# Patient Record
Sex: Female | Born: 1975
Health system: Southern US, Community
[De-identification: ages and names within clinical notes are randomized; demographics above are authoritative.]

## PROBLEM LIST (undated history)

## (undated) DIAGNOSIS — I10 Essential (primary) hypertension: Secondary | ICD-10-CM

## (undated) DIAGNOSIS — E119 Type 2 diabetes mellitus without complications: Secondary | ICD-10-CM

## (undated) HISTORY — DX: Type 2 diabetes mellitus without complications: E11.9

## (undated) HISTORY — DX: Essential (primary) hypertension: I10

---

## 1999-07-31 ENCOUNTER — Inpatient Hospital Stay (HOSPITAL_COMMUNITY): Admission: AD | Admit: 1999-07-31 | Discharge: 1999-07-31 | Payer: Self-pay | Admitting: Obstetrics

## 1999-08-01 ENCOUNTER — Ambulatory Visit (HOSPITAL_COMMUNITY): Admission: RE | Admit: 1999-08-01 | Discharge: 1999-08-01 | Payer: Self-pay | Admitting: Obstetrics

## 1999-09-07 ENCOUNTER — Inpatient Hospital Stay (HOSPITAL_COMMUNITY): Admission: AD | Admit: 1999-09-07 | Discharge: 1999-09-07 | Payer: Self-pay | Admitting: *Deleted

## 1999-09-09 ENCOUNTER — Encounter (INDEPENDENT_AMBULATORY_CARE_PROVIDER_SITE_OTHER): Payer: Self-pay

## 1999-09-09 ENCOUNTER — Observation Stay (HOSPITAL_COMMUNITY): Admission: AD | Admit: 1999-09-09 | Discharge: 1999-09-10 | Payer: Self-pay | Admitting: Obstetrics & Gynecology

## 2008-04-06 ENCOUNTER — Encounter: Payer: Self-pay | Admitting: Internal Medicine

## 2010-07-28 ENCOUNTER — Encounter: Payer: Self-pay | Admitting: Obstetrics & Gynecology

## 2010-07-29 ENCOUNTER — Ambulatory Visit (HOSPITAL_COMMUNITY)
Admission: RE | Admit: 2010-07-29 | Discharge: 2010-07-29 | Payer: Self-pay | Source: Home / Self Care | Attending: Obstetrics | Admitting: Obstetrics

## 2010-08-05 ENCOUNTER — Ambulatory Visit (HOSPITAL_COMMUNITY)
Admission: RE | Admit: 2010-08-05 | Discharge: 2010-08-05 | Payer: Self-pay | Source: Home / Self Care | Attending: Obstetrics | Admitting: Obstetrics

## 2010-08-05 LAB — GLUCOSE, CAPILLARY: Glucose-Capillary: 168 mg/dL — ABNORMAL HIGH (ref 70–99)

## 2010-08-05 LAB — CBC
HCT: 34.3 % — ABNORMAL LOW (ref 36.0–46.0)
Hemoglobin: 11.6 g/dL — ABNORMAL LOW (ref 12.0–15.0)
MCH: 27 pg (ref 26.0–34.0)
MCHC: 33.8 g/dL (ref 30.0–36.0)
MCV: 79.8 fL (ref 78.0–100.0)
Platelets: 339 10*3/uL (ref 150–400)
RBC: 4.3 MIL/uL (ref 3.87–5.11)
RDW: 15.6 % — ABNORMAL HIGH (ref 11.5–15.5)
WBC: 5.5 10*3/uL (ref 4.0–10.5)

## 2010-08-05 LAB — ABO/RH: ABO/RH(D): O POS

## 2010-08-07 LAB — GLUCOSE, CAPILLARY: Glucose-Capillary: 126 mg/dL — ABNORMAL HIGH (ref 70–99)

## 2010-08-08 ENCOUNTER — Other Ambulatory Visit: Payer: Self-pay | Admitting: Obstetrics

## 2010-08-08 ENCOUNTER — Inpatient Hospital Stay (HOSPITAL_COMMUNITY): Payer: BC Managed Care – PPO

## 2010-08-08 ENCOUNTER — Ambulatory Visit (HOSPITAL_COMMUNITY)
Admission: AD | Admit: 2010-08-08 | Discharge: 2010-08-08 | Disposition: A | Discharge status: Home / Self Care | Payer: BC Managed Care – PPO | Source: Ambulatory Visit | Attending: Obstetrics | Admitting: Obstetrics

## 2010-08-08 DIAGNOSIS — O034 Incomplete spontaneous abortion without complication: Secondary | ICD-10-CM | POA: Insufficient documentation

## 2010-08-08 LAB — GLUCOSE, CAPILLARY: Glucose-Capillary: 171 mg/dL — ABNORMAL HIGH (ref 70–99)

## 2010-08-08 LAB — CBC
MCHC: 33.3 g/dL (ref 30.0–36.0)
Platelets: 310 10*3/uL (ref 150–400)
RDW: 15.5 % (ref 11.5–15.5)

## 2010-08-26 NOTE — Op Note (Signed)
  Traci Warren, Traci Warren            ACCOUNT NO.:  [id number]  MEDICAL RECORD NO.:  [id number]           PATIENT TYPE:  I  LOCATION:  [facility]                          [facility]  PHYSICIAN:  Emilija Bohman A. Clearance Coots, M.D.DATE OF BIRTH:  1976/02/15  DATE OF PROCEDURE:  08/08/2010 DATE OF DISCHARGE:                              OPERATIVE REPORT   PREOPERATIVE DIAGNOSIS:  Retained products of conception after suction, dilation and evacuation for 8-week intrauterine fetal demise.  POSTOPERATIVE DIAGNOSIS:  Retained products of conception after suction, dilation and evacuation for 8-week intrauterine fetal demise, probable tightly adherent placenta.  PROCEDURE:  Suction, dilation and curettage.  SURGEON:  Vianna Venezia A. Clearance Coots, MD  ANESTHESIA:  General and local paracervical block with 20 mL of 1% Xylocaine.  FINDINGS:  Products of conception.  SPECIMEN:  Products of conception.  DISPOSITION OF SPECIMEN:  Pathology.  ESTIMATED BLOOD LOSS:  100 mL.  COMPLICATIONS:  None.  OPERATION:  The patient was brought to the operating room and after satisfactory general endotracheal anesthesia, the vagina was prepped and draped in the usual sterile fashion.  Urinary bladder was emptied of approximately 100 mL of clear urine.  Sterile speculum was inserted in the vaginal vault.  Products of conception could be seen within the cervical os.  The products of conception were grasped with a small ring forceps and submitted to Pathology for evaluation.  The endometrial surface was then thoroughly curetted with a medium sharp curette.  A #10 suction cath was then easily introduced into the uterine contents and the remainder of products of conception were evacuated.  The endometrial surface felt gritty all the way around after a conclusion of the procedure and the uterus contracted down quite well.  There was no active bleeding at the conclusion of the procedure.  All instruments were retired.  The  patient tolerated the procedure well, transported to the recovery room in satisfactory condition.     Jazlene Bares A. Clearance Coots, M.D.     CAH/MEDQ  D:  08/08/2010  T:  08/09/2010  Job:  [id number]  Electronically Signed by Coral Ceo M.D. on 08/26/2010 09:48:43 AM

## 2010-08-26 NOTE — Op Note (Signed)
  [name], [name]            ACCOUNT NO.:  [id number]  MEDICAL RECORD NO.:  [id number]          PATIENT TYPE:  AMB  LOCATION:  [facility]                           [facility]  PHYSICIAN:  Cambre Matson A. Clearance Coots, M.D.DATE OF BIRTH:  22-Sep-1975  DATE OF PROCEDURE:  08/05/2010 DATE OF DISCHARGE:  08/05/2010                              OPERATIVE REPORT   PREOPERATIVE DIAGNOSIS:  First trimester, missed abortion.  POSTOPERATIVE DIAGNOSIS:  First trimester, missed abortion.  PROCEDURE:  Suction, dilation, and evacuation.  SURGEON:  Jahmia Berrett A. Clearance Coots, MD  ANESTHESIA:  MAC with paracervical block, 1% Xylocaine plain.  ESTIMATED BLOOD LOSS:  100 mL.  COMPLICATIONS:  None.  FINDINGS:  Products of conception.  SPECIMENS:  Products of conception.  DISPOSITION:  Specimen to Pathology.  OPERATION:  The patient was brought to the operating room after satisfactory IV sedation.  The legs were brought up in stirrups and thevagina was prepped and draped in the usual sterile fashion.  The urinary bladder was emptied of approximately 100 mL of clear urine.  Sterile Graves speculum was inserted in the vaginal vault.  Cervix was isolated. The anterior lip of cervix was grasped with a single-tooth tenaculum. Paracervical block of 20 mL of 1% plain Xylocaine 10 mL injected in each lateral fornix.  The axis of the uterus was then sounded.  The cervix was then dilated to 25 mm with the [facility] dilators, #8 suction cath was easily introduced into the uterine cavity and all contents were evacuated.  The endometrial surface was then curetted with a small sharp curette and no further products were obtained.  There was no active bleeding at the conclusion of the procedure.  All instruments were retired.  The patient tolerated the procedure well and was transported to recovery room in satisfactory condition.    Traci Warren A. Clearance Coots, M.D.    CAH/MEDQ  D:  08/05/2010  T:  08/06/2010  Job:   [id number]  Electronically Signed by Coral Ceo M.D. on 08/26/2010 09:48:39 AM

## 2010-09-09 ENCOUNTER — Ambulatory Visit: Payer: Medicaid Other | Admitting: Internal Medicine

## 2010-09-13 ENCOUNTER — Other Ambulatory Visit: Payer: BC Managed Care – PPO

## 2010-09-13 ENCOUNTER — Other Ambulatory Visit: Payer: Self-pay | Admitting: Internal Medicine

## 2010-09-13 ENCOUNTER — Encounter: Payer: Self-pay | Admitting: Internal Medicine

## 2010-09-13 ENCOUNTER — Ambulatory Visit (INDEPENDENT_AMBULATORY_CARE_PROVIDER_SITE_OTHER): Payer: BC Managed Care – PPO | Admitting: Internal Medicine

## 2010-09-13 ENCOUNTER — Telehealth: Payer: Self-pay | Admitting: Internal Medicine

## 2010-09-13 DIAGNOSIS — E669 Obesity, unspecified: Secondary | ICD-10-CM

## 2010-09-13 DIAGNOSIS — I10 Essential (primary) hypertension: Secondary | ICD-10-CM

## 2010-09-13 DIAGNOSIS — E119 Type 2 diabetes mellitus without complications: Secondary | ICD-10-CM | POA: Insufficient documentation

## 2010-09-13 DIAGNOSIS — J45909 Unspecified asthma, uncomplicated: Secondary | ICD-10-CM | POA: Insufficient documentation

## 2010-09-13 LAB — HEPATIC FUNCTION PANEL
ALT: 24 U/L (ref 0–35)
Total Bilirubin: 0.5 mg/dL (ref 0.3–1.2)
Total Protein: 8 g/dL (ref 6.0–8.3)

## 2010-09-13 LAB — CBC WITH DIFFERENTIAL/PLATELET
Eosinophils Relative: 3.9 % (ref 0.0–5.0)
HCT: 34.4 % — ABNORMAL LOW (ref 36.0–46.0)
Lymphs Abs: 1.7 10*3/uL (ref 0.7–4.0)
MCV: 84.3 fl (ref 78.0–100.0)
Monocytes Absolute: 0.5 10*3/uL (ref 0.1–1.0)
Platelets: 345 10*3/uL (ref 150.0–400.0)
WBC: 6.4 10*3/uL (ref 4.5–10.5)

## 2010-09-13 LAB — URINALYSIS, ROUTINE W REFLEX MICROSCOPIC
Bilirubin Urine: NEGATIVE
Ketones, ur: NEGATIVE
Leukocytes, UA: NEGATIVE
Urobilinogen, UA: 0.2 (ref 0.0–1.0)

## 2010-09-13 LAB — BASIC METABOLIC PANEL
BUN: 26 mg/dL — ABNORMAL HIGH (ref 6–23)
CO2: 29 mEq/L (ref 19–32)
Chloride: 102 mEq/L (ref 96–112)
Glucose, Bld: 120 mg/dL — ABNORMAL HIGH (ref 70–99)
Potassium: 3.6 mEq/L (ref 3.5–5.1)

## 2010-09-13 LAB — LIPID PANEL
Cholesterol: 167 mg/dL (ref 0–200)
LDL Cholesterol: 98 mg/dL (ref 0–99)

## 2010-09-13 LAB — HEMOGLOBIN A1C: Hgb A1c MFr Bld: 7 % — ABNORMAL HIGH (ref 4.6–6.5)

## 2010-09-13 LAB — TSH: TSH: 1.52 u[IU]/mL (ref 0.35–5.50)

## 2010-09-16 ENCOUNTER — Encounter: Payer: Self-pay | Admitting: Internal Medicine

## 2010-09-16 ENCOUNTER — Telehealth: Payer: Self-pay | Admitting: Internal Medicine

## 2010-09-16 ENCOUNTER — Other Ambulatory Visit: Payer: BC Managed Care – PPO

## 2010-09-16 ENCOUNTER — Ambulatory Visit (INDEPENDENT_AMBULATORY_CARE_PROVIDER_SITE_OTHER): Payer: BC Managed Care – PPO | Admitting: Internal Medicine

## 2010-09-16 ENCOUNTER — Other Ambulatory Visit: Payer: Self-pay | Admitting: Internal Medicine

## 2010-09-16 DIAGNOSIS — E119 Type 2 diabetes mellitus without complications: Secondary | ICD-10-CM

## 2010-09-16 DIAGNOSIS — I10 Essential (primary) hypertension: Secondary | ICD-10-CM

## 2010-09-16 DIAGNOSIS — N179 Acute kidney failure, unspecified: Secondary | ICD-10-CM | POA: Insufficient documentation

## 2010-09-16 LAB — URINALYSIS, ROUTINE W REFLEX MICROSCOPIC
Bilirubin Urine: NEGATIVE
Ketones, ur: NEGATIVE
Nitrite: POSITIVE
Total Protein, Urine: NEGATIVE
pH: 5.5 (ref 5.0–8.0)

## 2010-09-16 LAB — BASIC METABOLIC PANEL
BUN: 21 mg/dL (ref 6–23)
Chloride: 99 mEq/L (ref 96–112)
Glucose, Bld: 101 mg/dL — ABNORMAL HIGH (ref 70–99)
Potassium: 3.8 mEq/L (ref 3.5–5.1)

## 2010-09-17 ENCOUNTER — Other Ambulatory Visit: Payer: Self-pay | Admitting: Internal Medicine

## 2010-09-17 DIAGNOSIS — N179 Acute kidney failure, unspecified: Secondary | ICD-10-CM

## 2010-09-17 NOTE — Letter (Signed)
Summary: Lipid Letter  [facility]  [address] [place], [place] [address]   Phone: [phone]  Fax: [phone]    09/13/2010  Traci Warren [address] [place], [place]  [address]  Dear Traci Warren:  We have carefully reviewed your last lipid profile from 09/13/2010 and the results are noted below with a summary of recommendations for lipid management.    Cholesterol:       167     Goal: <200   HDL "good" Cholesterol:   57.84     Goal: >50   LDL "bad" Cholesterol:   98     Goal: <100   Triglycerides:       169.0     Goal: <150    your labs show mild kidney dysfunction and anemia, please follow-up as soon as you can    TLC Diet (Therapeutic Lifestyle Change): Saturated Fats & Transfatty acids should be kept < 7% of total calories ***Reduce Saturated Fats Polyunstaurated Fat can be up to 10% of total calories Monounsaturated Fat Fat can be up to 20% of total calories Total Fat should be no greater than 25-35% of total calories Carbohydrates should be 50-60% of total calories Protein should be approximately 15% of total calories Fiber should be at least 20-30 grams a day ***Increased fiber may help lower LDL Total Cholesterol should be < 200mg /day Consider adding plant stanol/sterols to diet (example: Benacol spread) ***A higher intake of unsaturated fat may reduce Triglycerides and Increase HDL    Adjunctive Measures (may lower LIPIDS and reduce risk of Heart Attack) include: Aerobic Exercise (20-30 minutes 3-4 times a week) Limit Alcohol Consumption Weight Reduction Aspirin 75-81 mg a day by mouth (if not allergic or contraindicated) Dietary Fiber 20-30 grams a day by mouth     Current Medications: 1)    Metformin Hcl 500 Mg Tabs (Metformin hcl) .... 4 tabs daily 2)    Triamterene-hctz 37.5-25 Mg Tabs (Triamterene-hctz) .... Take 1 tablet by mouth every morning 3)    Carvedilol 6.25 Mg Tabs (Carvedilol) .... Take 1 tablet by mouth two times a  day 4)    Designer, multimedia W/device Kit (Blood glucose monitoring suppl) .... Use two times a day as ddirected 5)    Bayer Contour Test  Strp (Glucose blood) .... Use two times a day as directed  If you have any questions, please call. We appreciate being able to work with you.   Sincerely,    [facility] Traci Grandchild MD

## 2010-09-17 NOTE — Assessment & Plan Note (Signed)
Summary: New / Bcbs / #   Vital Signs:  Patient profile:   35 year old female Menstrual status:  regular LMP:     09/09/2010 Height:      59 inches Weight:      177 pounds BMI:     35.88 O2 Sat:      98 % on Room air Temp:     98.3 degrees F oral Pulse rate:   74 / minute Pulse rhythm:   regular Resp:     16 per minute BP sitting:   142 / 90  (left arm) Cuff size:   large  Vitals Entered By: Rock Nephew CMA (September 13, 2010 10:50 AM)  Nutrition Counseling: Patient's BMI is greater than 25 and therefore counseled on weight management options.  O2 Flow:  Room air  Primary Care Provider:  Etta Grandchild MD   History of Present Illness: New to me for evaluation and treatment of DM II - she tells me that she has been treated by a Gyn. named Dr. Clearance Coots but she does know what an A1C is and there are no prior records available to me today.     This is a 34 year old woman who presents for Follow-up visit.  The patient denies chest pain, palpitations, dizziness, syncope, low blood sugar symptoms, high blood sugar symptoms, edema, SOB, DOE, PND, and orthopnea.  Since the last visit the patient notes no new problems or concerns.  The patient reports taking meds as prescribed, not monitoring BP, not monitoring blood sugars, and dietary noncompliance.  When questioned about possible medication side effects, the patient notes none.    Hypertension History:      She denies headache, chest pain, palpitations, dyspnea with exertion, orthopnea, PND, peripheral edema, visual symptoms, neurologic problems, syncope, and side effects from treatment.  She notes no problems with any antihypertensive medication side effects.        Positive major cardiovascular risk factors include diabetes and hypertension.  Negative major cardiovascular risk factors include female age less than 35 years old, no history of hyperlipidemia, negative family history for ischemic heart disease, and non-tobacco-user status.        Further assessment for target organ damage reveals no history of ASHD, cardiac end-organ damage (CHF/LVH), stroke/TIA, peripheral vascular disease, renal insufficiency, or hypertensive retinopathy.     Preventive Screening-Counseling & Management  Alcohol-Tobacco     Alcohol drinks/day: 0     Alcohol Counseling: not indicated; patient does not drink     Smoking Status: never     Tobacco Counseling: not indicated; no tobacco use  Caffeine-Diet-Exercise     Does Patient Exercise: no  Hep-HIV-STD-Contraception     Hepatitis Risk: no risk noted     HIV Risk: no risk noted     STD Risk: no risk noted      Sexual History:  currently monogamous.        Drug Use:  no.        Blood Transfusions:  no.    Medications Prior to Update: 1)  None  Current Medications (verified): 1)  Metformin Hcl 500 Mg Tabs (Metformin Hcl) .... 4 Tabs Daily 2)  Triamterene-Hctz 37.5-25 Mg Tabs (Triamterene-Hctz) .... Take 1 Tablet By Mouth Every Morning 3)  Carvedilol 6.25 Mg Tabs (Carvedilol) .... Take 1 Tablet By Mouth Two Times A Day 4)  Bayer Contour Monitor W/device Kit (Blood Glucose Monitoring Suppl) .... Use Two Times A Day As Ddirected 5)  Bayer  Contour Test  Strp (Glucose Blood) .... 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 Never Smoked Alcohol use-no Drug use-no Regular exercise-no Smoking Status:  never Hepatitis Risk:  no risk noted HIV Risk:  no risk noted STD Risk:  no risk noted Sexual History:  currently monogamous Blood Transfusions:  no Drug Use:  no Does Patient Exercise:  no  Review of  Systems  The patient denies anorexia, fever, weight loss, weight gain, chest pain, syncope, dyspnea on exertion, peripheral edema, prolonged cough, headaches, hemoptysis, abdominal pain, hematuria, suspicious skin lesions, transient blindness, difficulty walking, depression, unusual weight change, abnormal bleeding, enlarged lymph nodes, and angioedema.   Endo:  Denies cold intolerance, excessive hunger, excessive thirst, excessive urination, heat intolerance, polyuria, and weight change.  Physical Exam  General:  alert, well-developed, well-nourished, well-hydrated, appropriate dress, normal appearance, healthy-appearing, cooperative to examination, good hygiene, and overweight-appearing.   Head:  normocephalic, atraumatic, no abnormalities observed, and no abnormalities palpated.   Eyes:  vision grossly intact and pupils equal.   Ears:  External ear exam shows no significant lesions or deformities.  Otoscopic examination reveals clear canals, tympanic membranes are intact bilaterally without bulging, retraction, inflammation or discharge. Hearing is grossly normal bilaterally. Nose:  External nasal examination shows no deformity or inflammation. Nasal mucosa are pink and moist without lesions or exudates. Mouth:  Oral mucosa and oropharynx without lesions or exudates.  Teeth in good repair. Neck:  No deformities, masses, or tenderness noted. Lungs:  Normal respiratory effort, chest expands symmetrically. Lungs are clear to auscultation, no crackles or wheezes. Heart:  Normal rate and regular rhythm. S1 and S2 normal without gallop, murmur, click, rub or other extra sounds. Abdomen:  Bowel sounds positive,abdomen soft and non-tender without masses, organomegaly or hernias noted. Msk:  No deformity or scoliosis noted of thoracic or lumbar spine.   Pulses:  R and L carotid,radial,femoral,dorsalis pedis and posterior tibial pulses are full and equal bilaterally Extremities:  No clubbing, cyanosis,  edema, or deformity noted with normal full range of motion of all joints.   Neurologic:  No cranial nerve deficits noted. Station and gait are normal. Plantar reflexes are down-going bilaterally. DTRs are symmetrical throughout. Sensory, motor and coordinative functions appear intact. Skin:  Intact without suspicious lesions or rashes Cervical Nodes:  No lymphadenopathy noted Axillary Nodes:  No palpable lymphadenopathy Psych:  Cognition and judgment appear intact. Alert and cooperative with normal attention span and concentration. No apparent delusions, illusions, hallucinations  Diabetes Management Exam:    Foot Exam (with socks and/or shoes not present):       Sensory-Pinprick/Light touch:          Left medial foot (L-4): normal          Left dorsal foot (L-5): normal          Left lateral foot (S-1): normal          Right medial foot (L-4): normal          Right dorsal foot (L-5): normal          Right lateral foot (S-1): normal       Sensory-Monofilament:  Left foot: normal          Right foot: normal       Inspection:          Left foot: normal          Right foot: normal       Nails:          Left foot: normal          Right foot: normal   Impression & Recommendations:  Problem # 1:  HYPERTENSION (ICD-401.9) Assessment New  she desires to become pregnant in the future so I did not prescribe an ACEI or an ARB, her current meds were given to her by her GYN doctor and she tells me that he told her they are ok for a female with a pregnancy risk  Her updated medication list for this problem includes:    Triamterene-hctz 37.5-25 Mg Tabs (Triamterene-hctz) .Marland Kitchen... Take 1 tablet by mouth every morning    Carvedilol 6.25 Mg Tabs (Carvedilol) .Marland Kitchen... Take 1 tablet by mouth two times a day  Orders: TLB-Lipid Panel (80061-LIPID) TLB-BMP (Basic Metabolic Panel-BMET) (80048-METABOL) TLB-CBC Platelet - w/Differential (85025-CBCD) TLB-Hepatic/Liver Function Pnl  (80076-HEPATIC) TLB-TSH (Thyroid Stimulating Hormone) (84443-TSH) TLB-A1C / Hgb A1C (Glycohemoglobin) (83036-A1C) TLB-Udip w/ Micro (81001-URINE) Diabetic Clinic Referral (Diabetic) Nutrition Referral (Nutrition)  BP today: 142/90  10 Yr Risk Heart Disease: Not enough information  Problem # 2:  DIABETES MELLITUS, TYPE II (ICD-250.00) Assessment: New no statin was prescribed as she is at risk of becoming pregnant  Her updated medication list for this problem includes:    Metformin Hcl 500 Mg Tabs (Metformin hcl) .Marland KitchenMarland KitchenMarland KitchenMarland Kitchen 4 tabs daily  Orders: TLB-Lipid Panel (80061-LIPID) TLB-BMP (Basic Metabolic Panel-BMET) (80048-METABOL) TLB-CBC Platelet - w/Differential (85025-CBCD) TLB-Hepatic/Liver Function Pnl (80076-HEPATIC) TLB-TSH (Thyroid Stimulating Hormone) (84443-TSH) TLB-A1C / Hgb A1C (Glycohemoglobin) (83036-A1C) TLB-Udip w/ Micro (81001-URINE) Ophthalmology Referral (Ophthalmology) Diabetic Clinic Referral (Diabetic) Nutrition Referral (Nutrition)  Problem # 3:  OBESITY (ICD-278.00) Assessment: New  Orders: Diabetic Clinic Referral (Diabetic) Nutrition Referral (Nutrition)  Ht: 59 (09/13/2010)   Wt: 177 (09/13/2010)   BMI: 35.88 (09/13/2010)  Complete Medication List: 1)  Metformin Hcl 500 Mg Tabs (Metformin hcl) .... 4 tabs daily 2)  Triamterene-hctz 37.5-25 Mg Tabs (Triamterene-hctz) .... Take 1 tablet by mouth every morning 3)  Carvedilol 6.25 Mg Tabs (Carvedilol) .... Take 1 tablet by mouth two times a day 4)  Designer, multimedia W/device Kit (Blood glucose monitoring suppl) .... Use two times a day as ddirected 5)  Bayer Contour Test Strp (Glucose blood) .... Use two times a day as directed  Hypertension Assessment/Plan:      The patient's hypertensive risk group is category C: Target organ damage and/or diabetes.  Today's blood pressure is 142/90.  Her blood pressure goal is < 130/80.  PAP Screening:    Last PAP smear:  04/06/2008    Reviewed PAP smear  recommendations:  patient defers to GYN provider  Osteoporosis Risk Assessment:  Risk Factors for Fracture or Low Bone Density:   Smoking status:       never  Immunization & Chemoprophylaxis:    Tetanus vaccine: Historical  (07/07/2009)  Patient Instructions: 1)  Please schedule a follow-up appointment in 1 month. 2)  It is important that you exercise regularly at least 20 minutes 5 times a week. If you develop chest pain, have severe difficulty breathing, or feel very tired , stop exercising immediately and seek medical attention. 3)  You need to lose weight. Consider a  lower calorie diet and regular exercise.  4)  You need to have a Pap Smear to prevent cervical cancer. 5)  If you could be exposed to sexually transmitted diseases, you should use a condom. 6)  If you are having sex and you or your partner don't want a child, use contraception. 7)  Check your blood sugars regularly. If your readings are usually above 200 or below 70 you should contact our office. 8)  It is important that your Diabetic A1c level is checked every 3 months. 9)  See your eye doctor yearly to check for diabetic eye damage. 10)  Check your feet each night for sore areas, calluses or signs of infection. 11)  Check your Blood Pressure regularly. If it is above 130/80: you should make an appointment. Prescriptions: BAYER CONTOUR TEST  STRP (GLUCOSE BLOOD) Use two times a day as directed  #100 x 0   Entered and Authorized by:   Etta Grandchild MD   Signed by:   Etta Grandchild MD on 09/13/2010   Method used:   Samples Given   RxID:   [id number] BAYER CONTOUR MONITOR W/DEVICE KIT (BLOOD GLUCOSE MONITORING SUPPL) Use two times a day as ddirected  #2 kits x 0   Entered and Authorized by:   Etta Grandchild MD   Signed by:   Etta Grandchild MD on 09/13/2010   Method used:   Samples Given   RxID:   [id number]    Orders Added: 1)  TLB-Lipid Panel [80061-LIPID] 2)  TLB-BMP (Basic Metabolic Panel-BMET)  [80048-METABOL] 3)  TLB-CBC Platelet - w/Differential [85025-CBCD] 4)  TLB-Hepatic/Liver Function Pnl [80076-HEPATIC] 5)  TLB-TSH (Thyroid Stimulating Hormone) [84443-TSH] 6)  TLB-A1C / Hgb A1C (Glycohemoglobin) [83036-A1C] 7)  TLB-Udip w/ Micro [81001-URINE] 8)  Ophthalmology Referral [Ophthalmology] 9)  Diabetic Clinic Referral [Diabetic] 10)  Nutrition Referral [Nutrition] 11)  New Patient Level IV [84696]   Not Administered:    Influenza Vaccine not given due to: declined   Preventive Care Screening  Last Tetanus Booster:    Date:  07/07/2009    Results:  Historical   Pap Smear:    Date:  04/06/2008    Results:  done      Prevention & Chronic Care Immunizations   Influenza vaccine: Not documented   Influenza vaccine deferral: Refused  (09/13/2010)    Tetanus booster: 07/07/2009: Historical    Pneumococcal vaccine: Not documented  Other Screening   Pap smear: done  (04/06/2008)   Pap smear action/deferral: patient defers to GYN provider  (09/13/2010)   Smoking status: never  (09/13/2010)  Diabetes Mellitus   HgbA1C: Not documented    Eye exam: Not documented    Foot exam: yes  (09/13/2010)   High risk foot: Not documented   Foot care education: Not documented    Urine microalbumin/creatinine ratio: Not documented  Lipids   Total Cholesterol: Not documented   LDL: Not documented   LDL Direct: Not documented   HDL: Not documented   Triglycerides: Not documented  Hypertension   Last Blood Pressure: 142 / 90  (09/13/2010)   Serum creatinine: Not documented   Serum potassium Not documented  Self-Management Support :    Diabetes self-management support: Not documented   Referred for diabetes self-mgmt training.   Referred.    Hypertension self-management support: Not documented

## 2010-09-24 NOTE — Assessment & Plan Note (Signed)
Summary: abn labs, f/u per MD/SD   Vital Signs:  Patient profile:   35 year old female Menstrual status:  regular LMP:     09/09/2010 Height:      59 inches Weight:      177 pounds BMI:     35.88 O2 Sat:      95 % on Room air Temp:     98.8 degrees F oral Pulse rate:   76 / minute Pulse rhythm:   regular Resp:     16 per minute BP sitting:   128 / 82  (left arm) Cuff size:   large  Vitals Entered By: Rock Nephew CMA (September 16, 2010 11:29 AM)  Nutrition Counseling: Patient's BMI is greater than 25 and therefore counseled on weight management options.  O2 Flow:  Room air CC: follow-up visit// lab results, Hypertension Management Is Patient Diabetic? Yes Did you bring your meter with you today? No Pain Assessment Patient in pain? no       Does patient need assistance? Functional Status Self care Ambulation Normal LMP (date): 09/09/2010     Enter LMP: 09/09/2010 Last PAP Result done   Primary Care Provider:  Etta Grandchild MD  CC:  follow-up visit// lab results and Hypertension Management.  History of Present Illness: She returns for f/up after last week's visit, her Creatinine is 2.2 and she had some blood in her urine. She now tells me that she has been having some nausea when she takes the Metformin.  Hypertension History:      She denies headache, chest pain, palpitations, dyspnea with exertion, orthopnea, PND, peripheral edema, visual symptoms, neurologic problems, syncope, and side effects from treatment.        Positive major cardiovascular risk factors include diabetes and hypertension.  Negative major cardiovascular risk factors include female age less than 67 years old, no history of hyperlipidemia, negative family history for ischemic heart disease, and non-tobacco-user status.        Positive history for target organ damage include renal insufficiency.  Further assessment for target organ damage reveals no history of ASHD, cardiac end-organ damage  (CHF/LVH), stroke/TIA, peripheral vascular disease, or hypertensive retinopathy.     Current Medications (verified): 1)  Metformin Hcl 500 Mg Tabs (Metformin Hcl) .... 4 Tabs Daily -Hold 2)  Triamterene-Hctz 37.5-25 Mg Tabs (Triamterene-Hctz) .... Take 1 Tablet By Mouth Every Morning 3)  Carvedilol 6.25 Mg Tabs (Carvedilol) .... Take 1 Tablet By Mouth Two Times A Day 4)  Bayer Contour Monitor W/device Kit (Blood Glucose Monitoring Suppl) .... Use Two Times A Day As Ddirected 5)  Bayer Contour Test  Strp (Glucose Blood) .... Use Two Times A Day As Directed  Allergies (verified): No Known Drug Allergies  Past History:  Past Medical History: Last updated: 09/13/2010 Diabetes mellitus, type II Hypertension Asthma as a child - no problems in many years with this  Past Surgical History: Last updated: 09/13/2010 Denies surgical history  Family History: Last updated: 09/13/2010 Family History Diabetes 1st degree relative Family History Hypertension  Social History: Last updated: 09/13/2010 Occupation: [employer] at [employer] Single Never Smoked Alcohol use-no Drug use-no Regular exercise-no  Risk Factors: Alcohol Use: 0 (09/13/2010) Exercise: no (09/13/2010)  Risk Factors: Smoking Status: never (09/13/2010)  Family History: Reviewed history from 09/13/2010 and no changes required. Family History Diabetes 1st degree relative Family History Hypertension  Social History: Reviewed history from 09/13/2010 and no changes required. Occupation: Cook at [employer] Alcohol use-no Drug use-no Regular  exercise-no  Review of Systems  The patient denies anorexia, fever, chest pain, syncope, dyspnea on exertion, peripheral edema, prolonged cough, headaches, hemoptysis, abdominal pain, hematuria, muscle weakness, suspicious skin lesions, depression, unusual weight change, abnormal bleeding, and enlarged lymph nodes.    Physical Exam  General:  alert,  well-developed, well-nourished, well-hydrated, appropriate dress, normal appearance, healthy-appearing, cooperative to examination, good hygiene, and overweight-appearing.   Head:  normocephalic, atraumatic, no abnormalities observed, and no abnormalities palpated.   Mouth:  Oral mucosa and oropharynx without lesions or exudates.  Teeth in good repair. Neck:  No deformities, masses, or tenderness noted. Lungs:  Normal respiratory effort, chest expands symmetrically. Lungs are clear to auscultation, no crackles or wheezes. Heart:  Normal rate and regular rhythm. S1 and S2 normal without gallop, murmur, click, rub or other extra sounds. Abdomen:  Bowel sounds positive,abdomen soft and non-tender without masses, organomegaly or hernias noted. Msk:  No deformity or scoliosis noted of thoracic or lumbar spine.   Pulses:  R and L carotid,radial,femoral,dorsalis pedis and posterior tibial pulses are full and equal bilaterally Extremities:  No clubbing, cyanosis, edema, or deformity noted with normal full range of motion of all joints.   Neurologic:  No cranial nerve deficits noted. Station and gait are normal. Plantar reflexes are down-going bilaterally. DTRs are symmetrical throughout. Sensory, motor and coordinative functions appear intact. Skin:  Intact without suspicious lesions or rashes Cervical Nodes:  No lymphadenopathy noted Psych:  Cognition and judgment appear intact. Alert and cooperative with normal attention span and concentration. No apparent delusions, illusions, hallucinations   Impression & Recommendations:  Problem # 1:  ACUTE KIDNEY FAILURE UNSPECIFIED (ICD-584.9) Assessment New stop Metformin Orders: TLB-BMP (Basic Metabolic Panel-BMET) (80048-METABOL) TLB-Udip w/ Micro (81001-URINE) Radiology Referral (Radiology)  Problem # 2:  DIABETES MELLITUS, TYPE II (ICD-250.00) Assessment: Improved  The following medications were removed from the medication list:    Metformin Hcl  500 Mg Tabs (Metformin hcl) .Marland KitchenMarland KitchenMarland KitchenMarland Kitchen 4 tabs daily -hold  Orders: TLB-BMP (Basic Metabolic Panel-BMET) (80048-METABOL) TLB-Udip w/ Micro (81001-URINE)  Labs Reviewed: Creat: 2.2 (09/13/2010)    Reviewed HgBA1c results: 7.0 (09/13/2010)  Problem # 3:  HYPERTENSION (ICD-401.9) Assessment: Improved  Her updated medication list for this problem includes:    Triamterene-hctz 37.5-25 Mg Tabs (Triamterene-hctz) .Marland Kitchen... Take 1 tablet by mouth every morning    Carvedilol 6.25 Mg Tabs (Carvedilol) .Marland Kitchen... Take 1 tablet by mouth two times a day  Orders: TLB-BMP (Basic Metabolic Panel-BMET) (80048-METABOL) TLB-Udip w/ Micro (81001-URINE)  BP today: 128/82 Prior BP: 142/90 (09/13/2010)  10 Yr Risk Heart Disease: 2 % Prior 10 Yr Risk Heart Disease: Not enough information (09/13/2010)  Labs Reviewed: K+: 3.6 (09/13/2010) Creat: : 2.2 (09/13/2010)   Chol: 167 (09/13/2010)   HDL: 35.10 (09/13/2010)   LDL: 98 (09/13/2010)   TG: 169.0 (09/13/2010)  Complete Medication List: 1)  Triamterene-hctz 37.5-25 Mg Tabs (Triamterene-hctz) .... Take 1 tablet by mouth every morning 2)  Carvedilol 6.25 Mg Tabs (Carvedilol) .... Take 1 tablet by mouth two times a day 3)  Designer, multimedia W/device Kit (Blood glucose monitoring suppl) .... Use two times a day as ddirected 4)  Bayer Contour Test Strp (Glucose blood) .... Use two times a day as directed  Hypertension Assessment/Plan:      The patient's hypertensive risk group is category C: Target organ damage and/or diabetes.  Her calculated 10 year risk of coronary heart disease is 2 %.  Today's blood pressure is 128/82.  Her blood pressure goal is <  130/80.  Patient Instructions: 1)  Please schedule a follow-up appointment in 1 month. 2)  It is important that you exercise regularly at least 20 minutes 5 times a week. If you develop chest pain, have severe difficulty breathing, or feel very tired , stop exercising immediately and seek medical attention. 3)  You  need to lose weight. Consider a lower calorie diet and regular exercise.  4)  Check your blood sugars regularly. If your readings are usually above 200 or below 70 you should contact our office. 5)  It is important that your Diabetic A1c level is checked every 3 months. 6)  See your eye doctor yearly to check for diabetic eye damage. 7)  Check your feet each night for sore areas, calluses or signs of infection. 8)  Check your Blood Pressure regularly. If it is above 130/80: you should make an appointment.   Orders Added: 1)  TLB-BMP (Basic Metabolic Panel-BMET) [80048-METABOL] 2)  TLB-Udip w/ Micro [81001-URINE] 3)  Radiology Referral [Radiology] 4)  Est. Patient Level III [93818]

## 2010-09-24 NOTE — Progress Notes (Signed)
  Phone Note Outgoing Call   Summary of Call: LA it looks like she has a bladder infection so we need a pharmacy for anitbiotics,  TJ Initial call taken by: Etta Grandchild MD,  September 16, 2010 3:34 PM  Follow-up for Phone Call        Patient notified and has picked up rx.Marland KitchenMarland KitchenAlvy Beal Archie CMA  September 19, 2010 8:19 AM     New/Updated Medications: SULFAMETHOXAZOLE-TMP DS 800-160 MG TAB (TRIMETHOPRIM-SULFAMETHOXAZOLE) Take 1 tablet by mouth morning and night Prescriptions: SULFAMETHOXAZOLE-TMP DS 800-160 MG TAB (TRIMETHOPRIM-SULFAMETHOXAZOLE) Take 1 tablet by mouth morning and night  #10 x 0   Entered and Authorized by:   Etta Grandchild MD   Signed by:   Etta Grandchild MD on 09/16/2010   Method used:   Electronically to        [facility]* (retail)       [phone]       [place], [place]  [address]       Ph: [phone]       Fax: [phone]   RxID:   [id number]

## 2010-09-24 NOTE — Progress Notes (Signed)
Summary: ABN LABS  Phone Note Outgoing Call   Summary of Call: LA - her labs show that she has mild kidney dysfunction, please ask her to stop the metformin and f/up with me asap. TJ Initial call taken by: Etta Grandchild MD,  September 13, 2010 1:33 PM  Follow-up for Phone Call        left mess to call office back...................Marland KitchenLamar Sprinkles, CMA  September 13, 2010 5:36 PM   Pt informed, scheduled for f/u today Follow-up by: Lamar Sprinkles, CMA,  September 16, 2010 10:48 AM    New/Updated Medications: METFORMIN HCL 500 MG TABS (METFORMIN HCL) 4 tabs daily -HOLD

## 2010-09-25 ENCOUNTER — Ambulatory Visit
Admission: RE | Admit: 2010-09-25 | Discharge: 2010-09-25 | Disposition: A | Discharge status: Home / Self Care | Payer: BC Managed Care – PPO | Source: Ambulatory Visit | Attending: Internal Medicine | Admitting: Internal Medicine

## 2010-09-25 DIAGNOSIS — N179 Acute kidney failure, unspecified: Secondary | ICD-10-CM

## 2010-11-22 NOTE — Op Note (Signed)
[facility]  Patient:    Traci Warren, Traci Warren                     MRN: [id number] Proc. Date: 09/10/99 Adm. Date:  [id number] Attending:  Tammi Sou                           Operative Report  PREOPERATIVE DIAGNOSIS:       Inevitable abortion at approximately [redacted] weeks gestation by LMP.  POSTOPERATIVE DIAGNOSIS:      Inevitable abortion at approximately [redacted] weeks gestation by LMP.  OPERATION:                    Dilatation and evacuation.  SURGEON:                      Conni Elliot, M.D.  ASSISTANT:  ANESTHESIA:                   MAC.  FINDINGS:                     The cervical os was opened to easily accept a #12  suction curet.  The fetus appeared to be at least [redacted] weeks gestational size.  ESTIMATED BLOOD LOSS:  DESCRIPTION OF PROCEDURE:     After placing the patient on MAC anesthesia, the patient in the supine dorsal lithotomy position, the perineum and vagina were prepped with Betadine solution.  The weighted speculum was placed in the posterior vagina.  A single tooth tenaculum was placed on the anterior lip of the cervix. A #12 suction curet was utilized to suction curet the products of conception. The sharp curettage was utilized to identify the completeness of the procedure. Estimated blood loss was approximately 200 cc.  Sponge, needle, and instrument counts were correct. DD:  09/10/99 TD:  09/10/99 Job: [id number]

## 2010-11-22 NOTE — Discharge Summary (Signed)
[facility]  Patient:    Traci Warren, Traci Warren                     MRN: [id number] Adm. Date:  [id number] Disc. Date: [id number] Attending:  Tammi Sou Dictator:   Jewel Baize, M.D.                           Discharge Summary  DISCHARGE DIAGNOSES:          1. Inevitable abortion.                               2. Status post dilatation and curettage.                               3. Vaginal bleeding.                               4. Anemia.  HISTORY OF PRESENT ILLNESS:   The patient is a 35 year old African-American female who presented as an out-of-town patient to Conni Elliot, M.D. with an inevitable abortion.  HOSPITAL COURSE:              She was taken to the operating room and secondary to a large amount of vaginal bleeding at that time she had a dilatation and curettage. She tolerated the procedure well and was placed on the floor for observation and started on Methergine 0.2 mg p.o. q.6h. as well as IV fluids and repeat CBC in he morning.  She showed a decrease in her hemoglobin as well as orthostatic blood pressures.  She was given an IV fluid bolus as well as started on ferrous sulfate. Her blood pressure was then rechecked.  The patient was on Cefotetan IV postoperative and will be discharged on oral antibiotics.  The patient was doing much better, was ambulating, taking good p.o., and vaginal bleeding has stopped. She requested to be discharged home.  DISPOSITION:                  Discharged to home.  CONDITION ON DISCHARGE:       Stable.  DISCHARGE MEDICATIONS:        1. Doxycycline 100 mg p.o. b.i.d. x 7 days.                               2. Methergine 0.2 mg p.o. q.6h. x 24 hours.                               3. Ferrous sulfate 325 mg p.o. b.i.d.  DISCHARGE INSTRUCTIONS:       She was instructed to increase fiber in her diet. She is to adhere to no sex, tampons, douching x 6 weeks.  She is to follow up  at [facility] in one week.  If she has any further symptoms, worsening vaginal bleeding, or symptoms related to anemia, she is to report to the hospital immediately. DD:  09/11/99 TD:  09/11/99 Job: [id number]

## 2011-02-16 ENCOUNTER — Other Ambulatory Visit: Payer: Self-pay | Admitting: Obstetrics

## 2011-06-15 ENCOUNTER — Other Ambulatory Visit: Payer: Self-pay | Admitting: Obstetrics

## 2011-10-10 ENCOUNTER — Other Ambulatory Visit: Payer: Self-pay | Admitting: Obstetrics

## 2012-10-18 IMAGING — US US OB COMP LESS 14 WK
2 series · 13 of 28 positions shown · non-contrast
Comparison: none

[Series 1: us ob comp less 14 wks · 6 of 28 slices shown (1 of 2)]
[im 3/28]
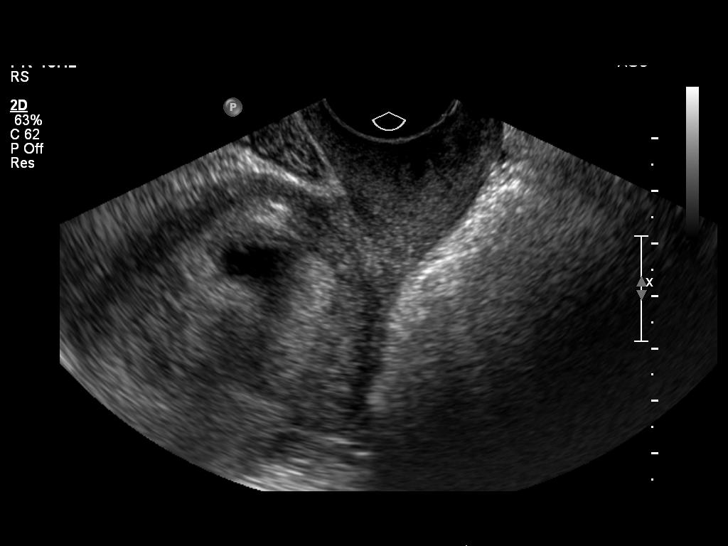
[im 7/28]
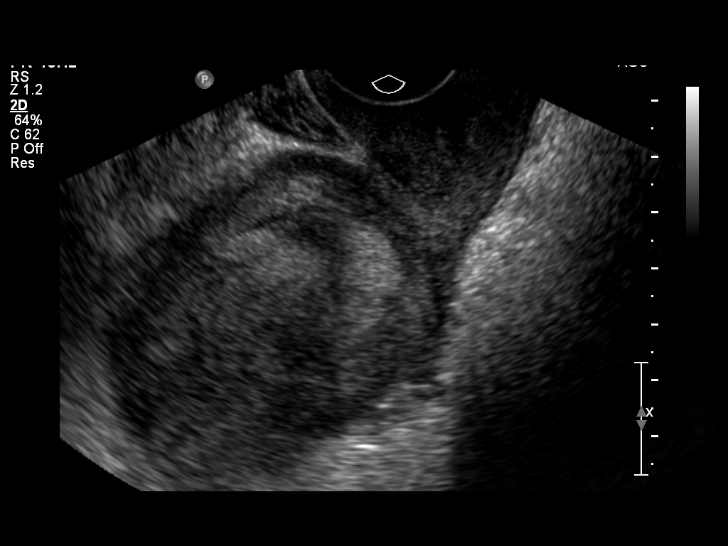
[im 11/28]
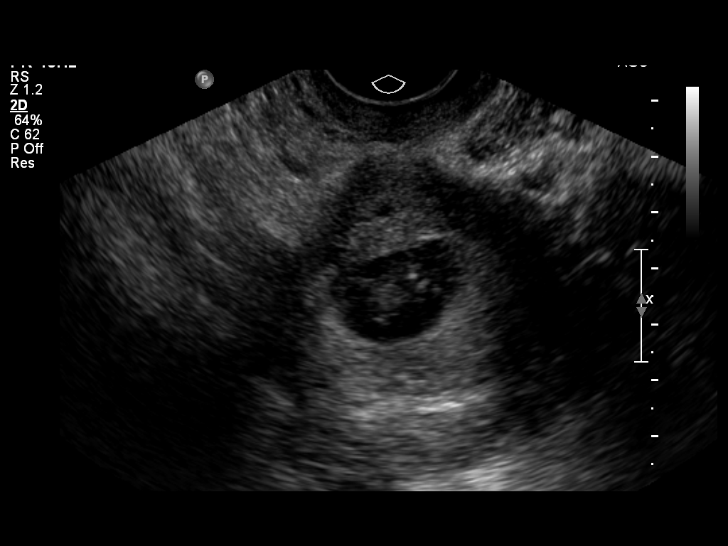
[im 15/28]
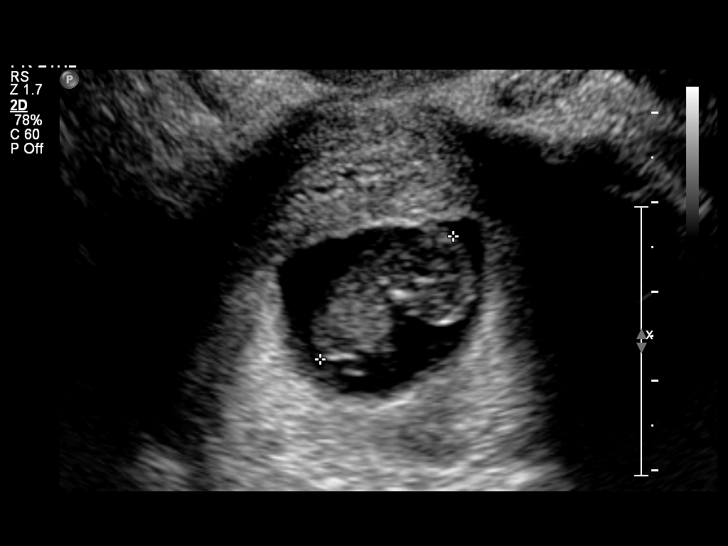
[im 19/28]
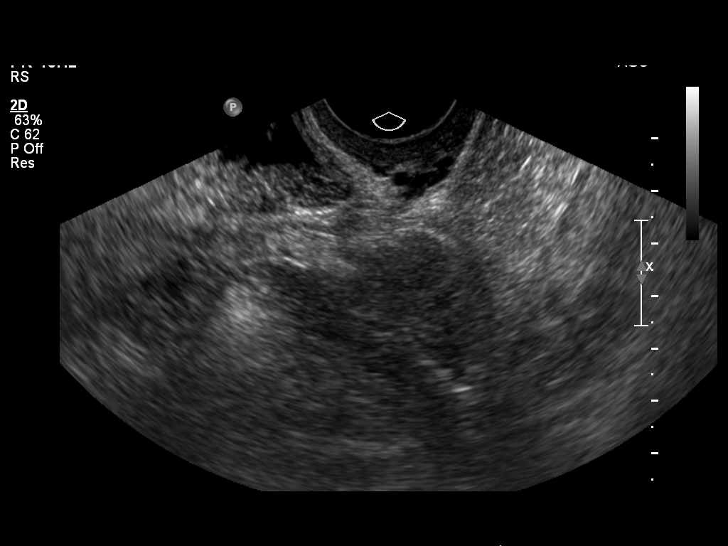
[im 23/28]
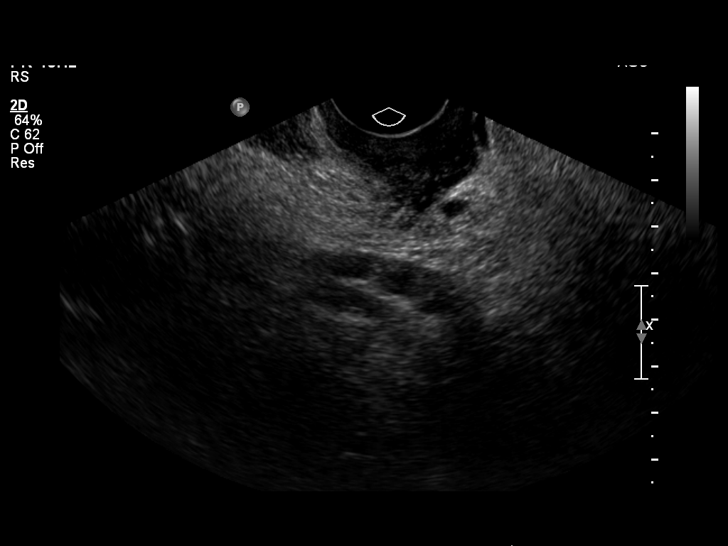

[Series 1: us ob comp less 14 wks · 7 of 27 slices shown (2 of 2)]
[im 1/27]
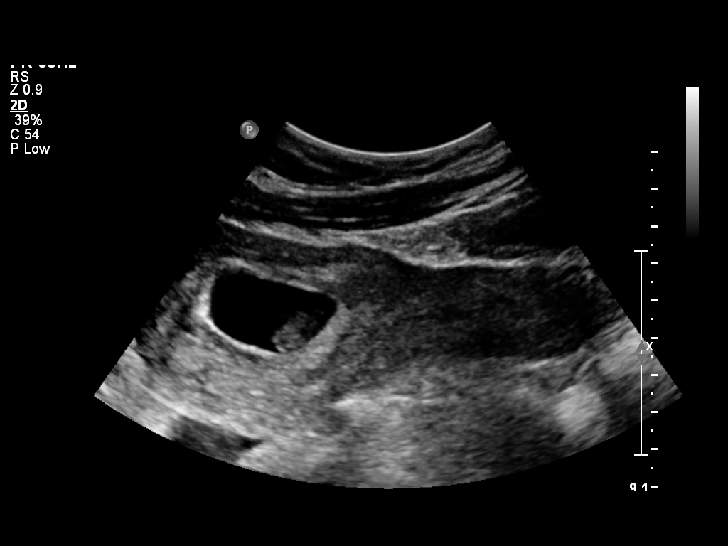
[im 5/27]
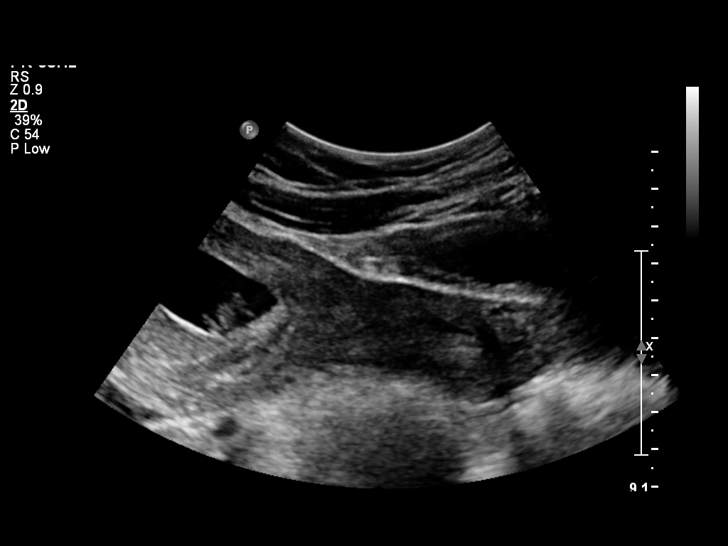
[im 9/27]
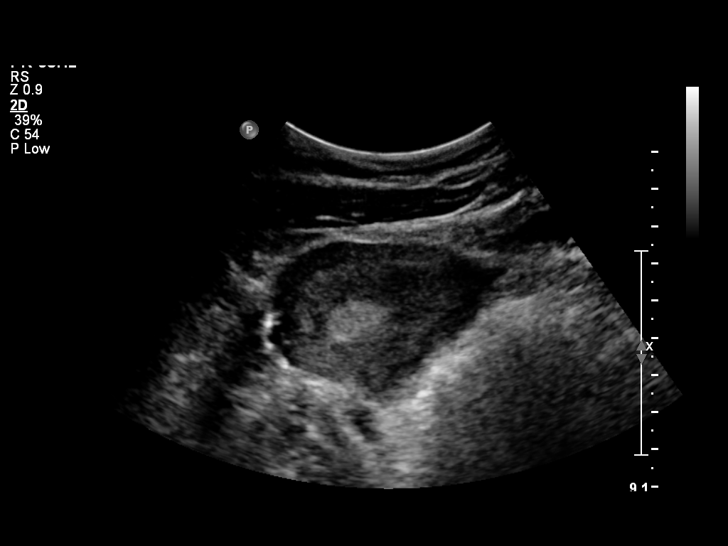
[im 13/27]
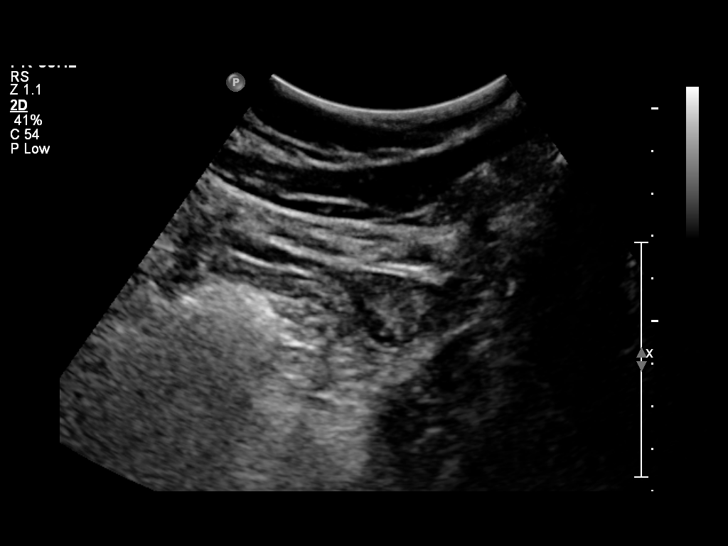
[im 17/27]
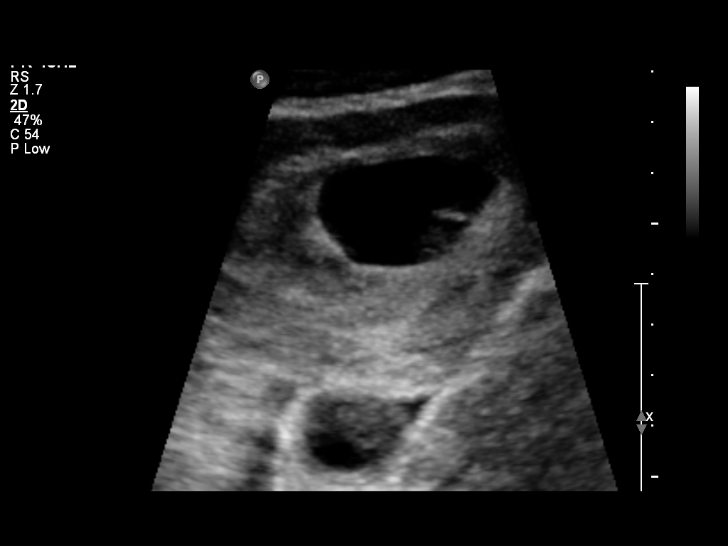
[im 21/27]
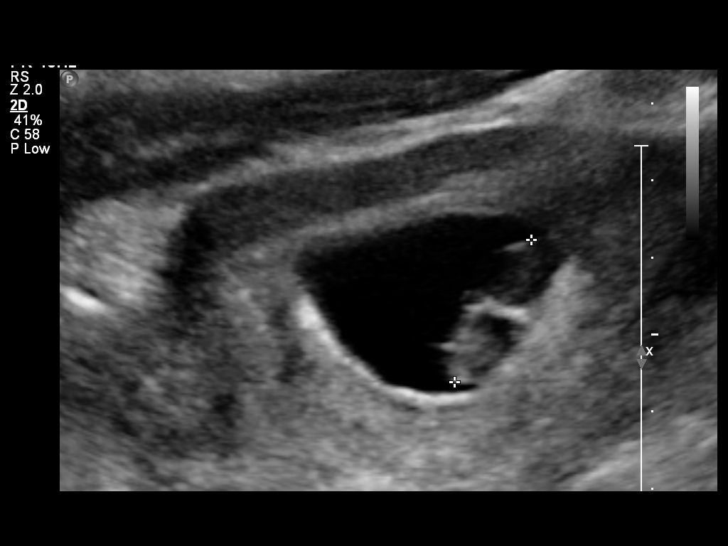
[im 25/27]
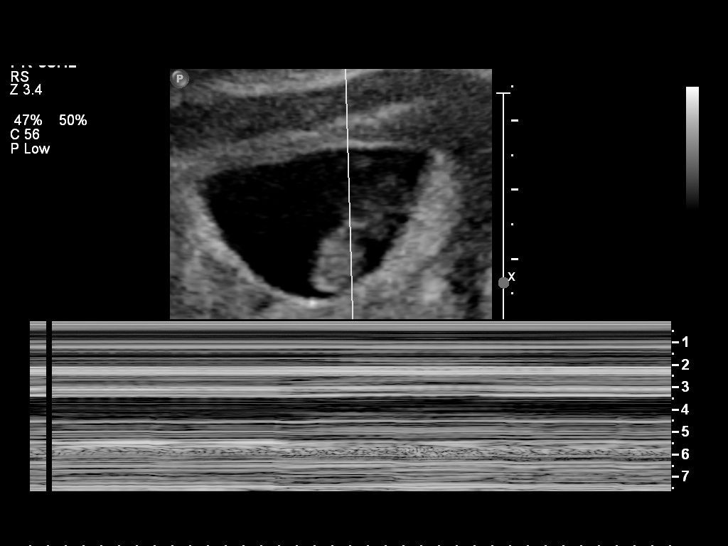

[13 of 28 positions shown; findings below may reference images not displayed]

OBSTETRICS REPORT
                      (Signed Final 07/29/2010 [DATE])

 Order#:         _O
Procedures

 US OB COMP LESS 14 WKS                                76801.0
 US OB Transvaginal Modify                             [id number]
Indications

 Diabetes - Gestational
 Hypertension - Chronic/Pre-existing
 Size less than dates (Small for gestational [AGE]
 FGR)
 Uncertain LMP;  Establish Gestational [AGE]
Fetal Evaluation

 Preg. Location:    Intrauterine
 Gest. Sac:         Intrauterine
 Yolk Sac:          Not visualized
 Fetal Pole:        Visualized
 Cardiac Activity:  Not visualized
Biometry

 CRL:     20.3  mm     G. Age:  8w 3d                  EDD:    03/07/11
Gestational Age

 LMP:           12w 6d        Date:  04/30/10                 EDD:   02/04/11
 Best:          12w 6d     Det. By:  LMP  (04/30/10)          EDD:   02/04/11
Cervix Uterus Adnexa

 Cervix:       Closed.
 Uterus:       No abnormality visualized.
 Cul De Sac:   No free fluid seen.
 Left Ovary:    Within normal limits.
 Right Ovary:   Within normal limits. Small corpus luteum noted.
 Adnexa:     No abnormality visualized.
Impression

 8w3d intrauterine fetal demise. Called to Dr. [REDACTED]
 by sonographer Nadine Millogo at the time of imaging.

## 2012-12-15 IMAGING — US US RENAL
1 series · 14 of 25 positions shown · non-contrast
Comparison: None.

CLINICAL DATA: Acute renal failure, diabetes, hypertension

RENAL/URINARY TRACT ULTRASOUND COMPLETE

[Series 1: us renal · 0.28mm/px · 14 of 32 slices shown]
[im 1/32]
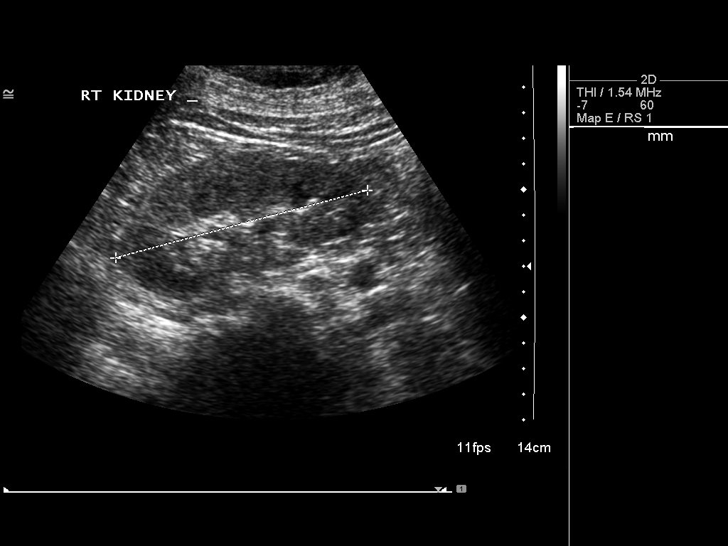
[im 3/32]
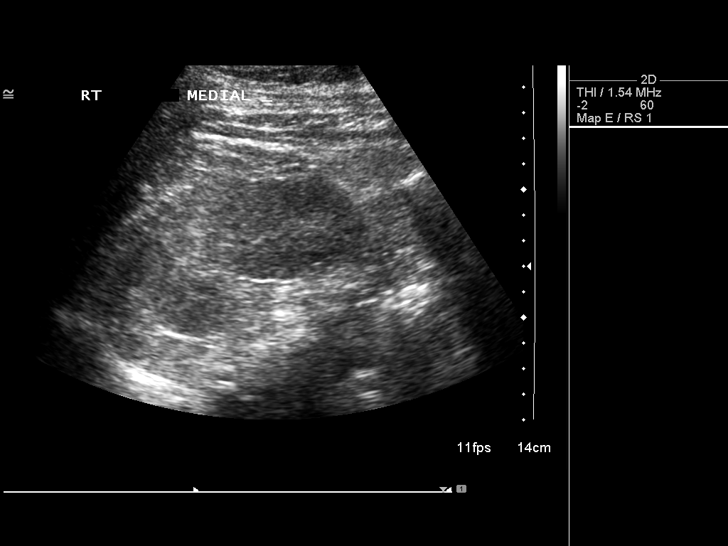
[im 6/32]
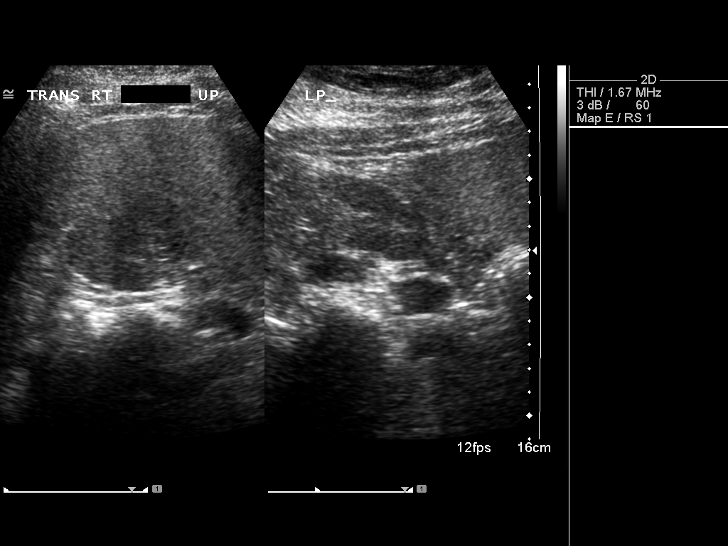
[im 8/32]
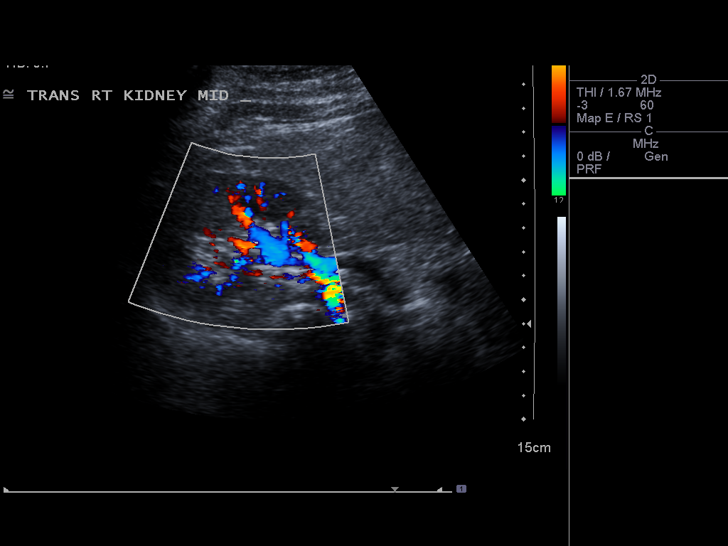
[im 11/32]
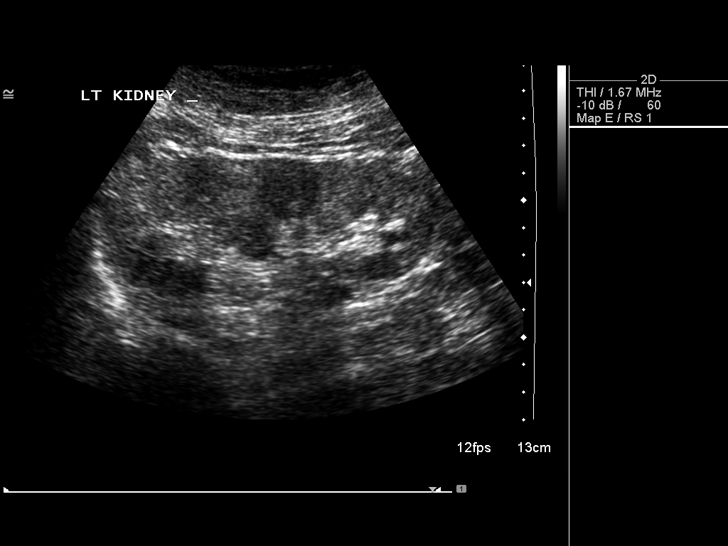
[im 12/32]
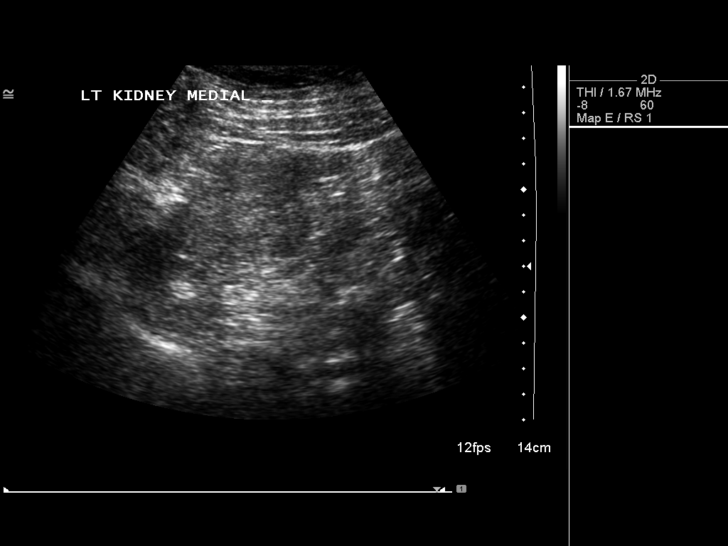
[im 15/32]
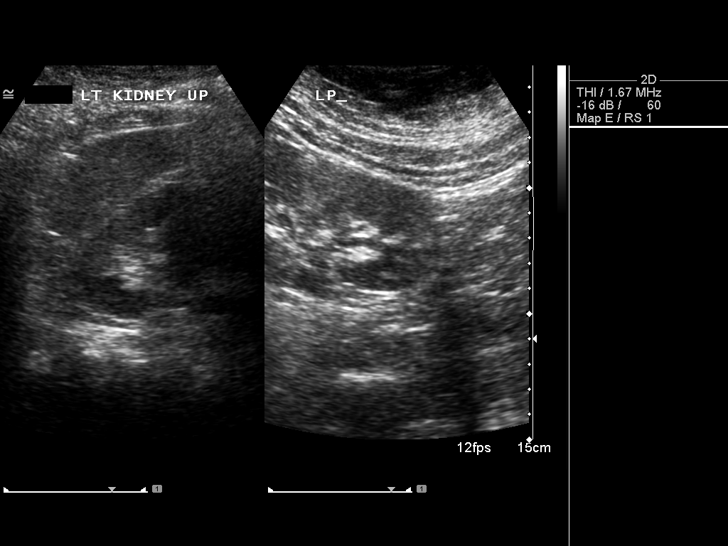
[im 17/32]
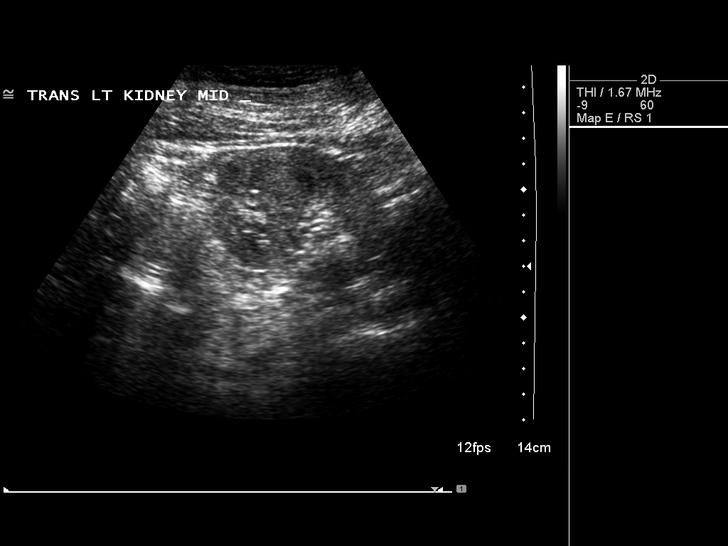
[im 20/32]
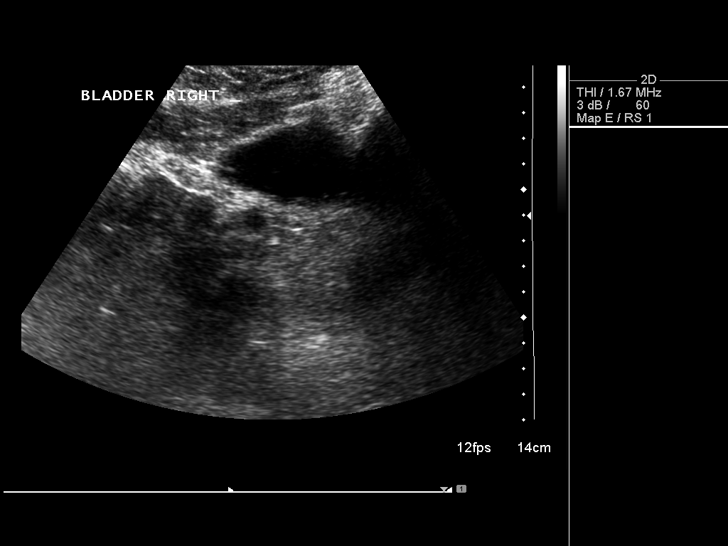
[im 21/32]
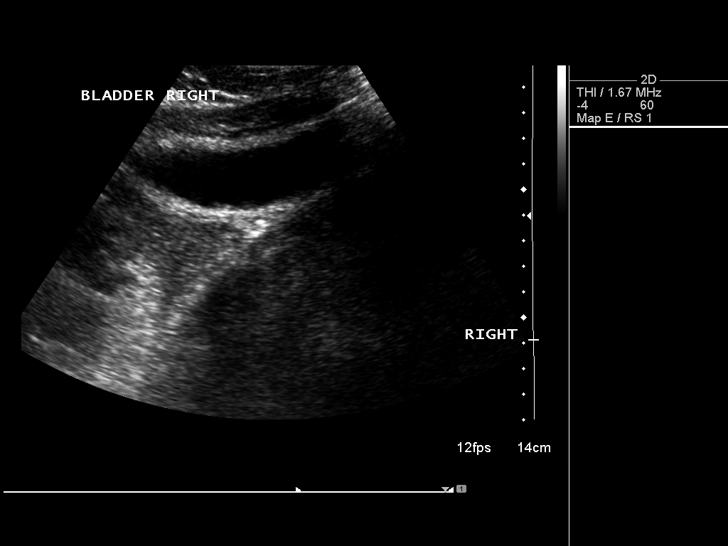
[im 24/32]
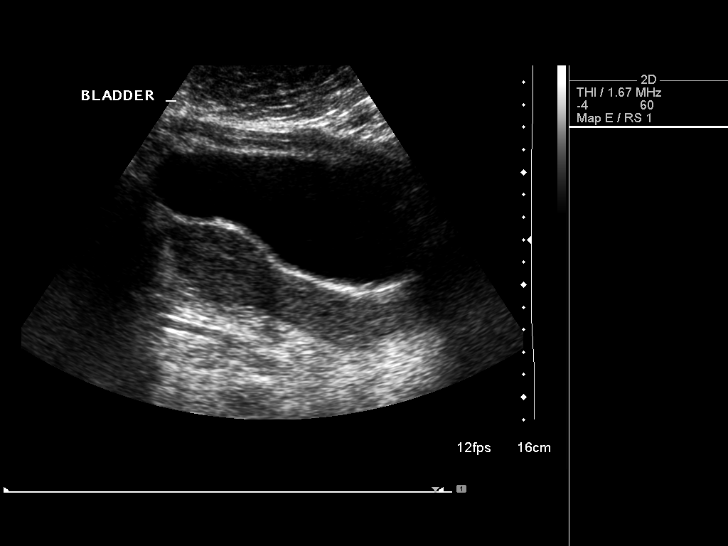
[im 26/32]
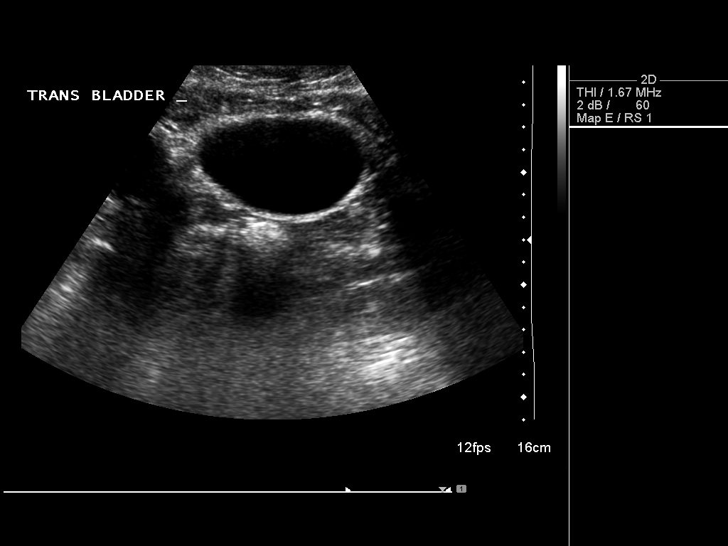
[im 29/32]
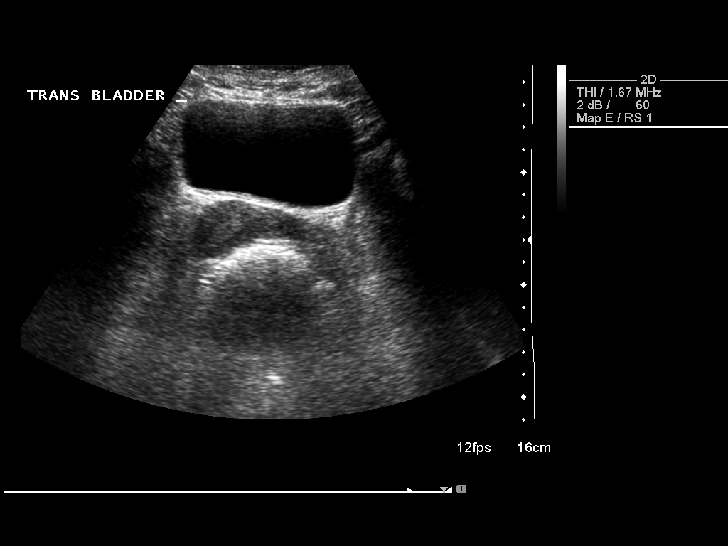
[im 32/32]
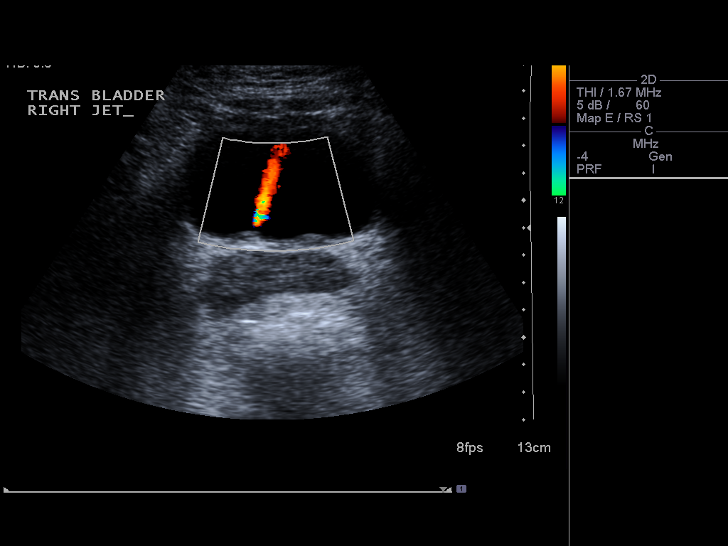

[14 of 25 positions shown; findings below may reference images not displayed]

FINDINGS: Right Kidney:  The right kidney measures 10.2 cm sagittally and the
parenchyma is slightly echogenic. No hydronephrosis is seen.

Left Kidney:  The left kidney measures 12.0 cm sagittally with
slightly increased echogenic pattern is well.  No hydronephrosis is
seen.

Bladder:  The urinary bladder is unremarkable.  Bilateral ureteral
jets are noted.
IMPRESSION: 1.  No hydronephrosis.
2. Slightly echogenic renal parenchyma consistent with chronic
renal medical disease.

## 2013-11-16 ENCOUNTER — Encounter: Payer: Self-pay | Admitting: *Deleted

## 2013-12-20 ENCOUNTER — Ambulatory Visit (INDEPENDENT_AMBULATORY_CARE_PROVIDER_SITE_OTHER): Payer: Medicaid Other | Admitting: Obstetrics and Gynecology

## 2013-12-20 ENCOUNTER — Ambulatory Visit (HOSPITAL_COMMUNITY)
Admission: RE | Admit: 2013-12-20 | Discharge: 2013-12-20 | Disposition: A | Discharge status: Home / Self Care | Payer: Medicaid Other | Source: Ambulatory Visit | Attending: Obstetrics and Gynecology | Admitting: Obstetrics and Gynecology

## 2013-12-20 ENCOUNTER — Encounter: Payer: Self-pay | Admitting: Obstetrics and Gynecology

## 2013-12-20 VITALS — BP 180/98 | HR 86 | Temp 98.1°F | Wt 179.3 lb

## 2013-12-20 DIAGNOSIS — R109 Unspecified abdominal pain: Secondary | ICD-10-CM

## 2013-12-20 DIAGNOSIS — Z3689 Encounter for other specified antenatal screening: Secondary | ICD-10-CM | POA: Insufficient documentation

## 2013-12-20 DIAGNOSIS — I10 Essential (primary) hypertension: Secondary | ICD-10-CM

## 2013-12-20 DIAGNOSIS — O26899 Other specified pregnancy related conditions, unspecified trimester: Secondary | ICD-10-CM

## 2013-12-20 DIAGNOSIS — O9989 Other specified diseases and conditions complicating pregnancy, childbirth and the puerperium: Principal | ICD-10-CM

## 2013-12-20 DIAGNOSIS — E119 Type 2 diabetes mellitus without complications: Secondary | ICD-10-CM

## 2013-12-20 DIAGNOSIS — O99891 Other specified diseases and conditions complicating pregnancy: Secondary | ICD-10-CM | POA: Insufficient documentation

## 2013-12-20 DIAGNOSIS — O039 Complete or unspecified spontaneous abortion without complication: Secondary | ICD-10-CM

## 2013-12-20 LAB — CBC
HCT: 37.9 % (ref 36.0–46.0)
Hemoglobin: 13.1 g/dL (ref 12.0–15.0)
MCH: 28.4 pg (ref 26.0–34.0)
MCHC: 34.6 g/dL (ref 30.0–36.0)
MCV: 82 fL (ref 78.0–100.0)
PLATELETS: 355 10*3/uL (ref 150–400)
RBC: 4.62 MIL/uL (ref 3.87–5.11)
RDW: 15.3 % (ref 11.5–15.5)
WBC: 7.7 10*3/uL (ref 4.0–10.5)

## 2013-12-20 LAB — POCT URINALYSIS DIP (DEVICE)
Bilirubin Urine: NEGATIVE
Glucose, UA: 500 mg/dL — AB
KETONES UR: NEGATIVE mg/dL
Leukocytes, UA: NEGATIVE
Nitrite: NEGATIVE
PROTEIN: NEGATIVE mg/dL
SPECIFIC GRAVITY, URINE: 1.025 (ref 1.005–1.030)
Urobilinogen, UA: 0.2 mg/dL (ref 0.0–1.0)
pH: 5.5 (ref 5.0–8.0)

## 2013-12-20 MED ORDER — LOSARTAN POTASSIUM 50 MG PO TABS: 50.0000 mg | ORAL_TABLET | Freq: Every day | ORAL | Status: DC

## 2013-12-20 MED ORDER — METFORMIN HCL 1000 MG PO TABS: 1000.0000 mg | ORAL_TABLET | Freq: Two times a day (BID) | ORAL | Status: DC

## 2013-12-20 MED ORDER — AMLODIPINE BESYLATE 10 MG PO TABS: 10.0000 mg | ORAL_TABLET | Freq: Every day | ORAL | Status: DC

## 2013-12-20 NOTE — Progress Notes (Signed)
Reports occasional cramping for the past few days.  Patient states she has not taken any of her BP medication or DM medication for about a month due to financial restriction-- states she has medicaid now and will be able to get medications. BP elevated-- denies headache, visual changes and abdominal pain.  Discussed appropriate weight gain (11-20lb); patient verbalized understanding.  New OB packet given.  New OB labs today. Pap today.

## 2013-12-20 NOTE — Progress Notes (Signed)
Bedside US performed to assess viability- IUP was not visualized using abdominal probe.  Pt will require transvag exam to confirm viable IUP.

## 2013-12-20 NOTE — Progress Notes (Signed)
Recheck 169/98 Having premenstrual-like cramps. No DT FH: Diane unable to see IUP> to US for viabliltiy and check quant. beta hCG Uncontrolled CHTN and DM, off meds x 1 month due to loss of insurance. Denies H/A, malaise.  CLINICAL DATA: Abdominal pain and early pregnancy  EXAM:  OBSTETRIC <14 WK US AND TRANSVAGINAL OB US  TECHNIQUE:  Both transabdominal and transvaginal ultrasound examinations were  performed for complete evaluation of the gestation as well as the  maternal uterus, adnexal regions, and pelvic cul-de-sac.  Transvaginal technique was performed to assess early pregnancy.  COMPARISON: None of the current pregnancy  FINDINGS:  Intrauterine gestational sac: Present. The sac has a normal round  shape but is mildly low within the endometrial cavity. Additionally,  it appears large relative to the fetal size.  Yolk sac: Not visible  Embryo: Visible  Cardiac Activity: Not visible by Doppler or grayscale cine imaging.  CRL: 10 mm 7 w 1d  Maternal uterus/adnexae: The ovaries are symmetric in size and  unremarkable in appearance. No subchorionic hematoma. No free pelvic  fluid. No adnexal mass.  IMPRESSION:  Single intrauterine gestation with 10 mm crown-rump length and no  cardiac activity. Findings meet definitive criteria for failed  pregnancy. This follows SRU consensus guidelines: Diagnostic  Criteria for Nonviable Pregnancy Early in the First Trimester. Macy Mis  Engl J Med [id number];369:1443-51.  Discussed EPF with pt at length, including options of expectant management, D&E or cytotec> wants to proceed with cytotec. Will draw labs here today, then phone pt tomorrow after calling in med.  Pregnancy unplanned and pt is not upset with the outcome. F/U here in 2 wks  12/21/13. Results for orders placed in visit on 12/20/13 (from the past 24 hour(s))  POCT URINALYSIS DIP (DEVICE)     Status: Abnormal   Collection Time    12/20/13  2:58 PM      Result Value Ref Range   Glucose, UA  500 (*) NEGATIVE mg/dL   Bilirubin Urine NEGATIVE  NEGATIVE   Ketones, ur NEGATIVE  NEGATIVE mg/dL   Specific Gravity, Urine 1.025  1.005 - 1.030   Hgb urine dipstick TRACE (*) NEGATIVE   pH 5.5  5.0 - 8.0   Protein, ur NEGATIVE  NEGATIVE mg/dL   Urobilinogen, UA 0.2  0.0 - 1.0 mg/dL   Nitrite NEGATIVE  NEGATIVE   Leukocytes, UA NEGATIVE  NEGATIVE  HCG, QUANTITATIVE, PREGNANCY     Status: None   Collection Time    12/20/13  3:51 PM      Result Value Ref Range   hCG, Beta Chain, Quant, S 25605.9     Narrative:    Performed at:  [facility]                [address], [address]                [place], [place] [address] SOURCE: BLOOD  CBC     Status: None   Collection Time    12/20/13  5:30 PM      Result Value Ref Range   WBC 7.7  4.0 - 10.5 K/uL   RBC 4.62  3.87 - 5.11 MIL/uL   Hemoglobin 13.1  12.0 - 15.0 g/dL   HCT 95.637.9  21.336.0 - 08.646.0 %   MCV 82.0  78.0 - 100.0 fL   MCH 28.4  26.0 - 34.0 pg   MCHC 34.6  30.0 - 36.0 g/dL   RDW 57.815.3  46.911.5 -  15.5 %   Platelets 355  150 - 400 K/uL   Narrative:    Performed at:  [facility]                [address], [address]                [place], [place] [address]  ABO AND RH      Status: None   Collection Time    12/20/13  5:30 PM      Result Value Ref Range   ABO Grouping O     Rh Type POS     Narrative:    Performed at:  [facility]                [address], [address]                [place], [place] [address] SOURCE: BLOOD BLOOD  Called pt. RX cytotec, Ultram, Phenergan, ibuprofen sent

## 2013-12-20 NOTE — Patient Instructions (Signed)
Miscarriage A miscarriage is the sudden loss of an unborn baby (fetus) before the 20th week of pregnancy. Most miscarriages happen in the first 3 months of pregnancy. Sometimes, it happens before a woman even knows she is pregnant. A miscarriage is also called a "spontaneous miscarriage" or "early pregnancy loss." Having a miscarriage can be an emotional experience. Talk with your caregiver about any questions you may have about miscarrying, the grieving process, and your future pregnancy plans. CAUSES   Problems with the fetal chromosomes that make it impossible for the baby to develop normally. Problems with the baby's genes or chromosomes are most often the result of errors that occur, by chance, as the embryo divides and grows. The problems are not inherited from the parents.  Infection of the cervix or uterus.   Hormone problems.   Problems with the cervix, such as having an incompetent cervix. This is when the tissue in the cervix is not strong enough to hold the pregnancy.   Problems with the uterus, such as an abnormally shaped uterus, uterine fibroids, or congenital abnormalities.   Certain medical conditions.   Smoking, drinking alcohol, or taking illegal drugs.   Trauma.  Often, the cause of a miscarriage is unknown.  SYMPTOMS   Vaginal bleeding or spotting, with or without cramps or pain.  Pain or cramping in the abdomen or lower back.  Passing fluid, tissue, or blood clots from the vagina. DIAGNOSIS  Your caregiver will perform a physical exam. You may also have an ultrasound to confirm the miscarriage. Blood or urine tests may also be ordered. TREATMENT   Sometimes, treatment is not necessary if you naturally pass all the fetal tissue that was in the uterus. If some of the fetus or placenta remains in the body (incomplete miscarriage), tissue left behind may become infected and must be removed. Usually, a dilation and curettage (D and C) procedure is performed.  During a D and C procedure, the cervix is widened (dilated) and any remaining fetal or placental tissue is gently removed from the uterus.  Antibiotic medicines are prescribed if there is an infection. Other medicines may be given to reduce the size of the uterus (contract) if there is a lot of bleeding.  If you have Rh negative blood and your baby was Rh positive, you will need a Rh immunoglobulin shot. This shot will protect any future baby from having Rh blood problems in future pregnancies. HOME CARE INSTRUCTIONS   Your caregiver may order bed rest or may allow you to continue light activity. Resume activity as directed by your caregiver.  Have someone help with home and family responsibilities during this time.   Keep track of the number of sanitary pads you use each day and how soaked (saturated) they are. Write down this information.   Do not use tampons. Do not douche or have sexual intercourse until approved by your caregiver.   Only take over-the-counter or prescription medicines for pain or discomfort as directed by your caregiver.   Do not take aspirin. Aspirin can cause bleeding.   Keep all follow-up appointments with your caregiver.   If you or your partner have problems with grieving, talk to your caregiver or seek counseling to help cope with the pregnancy loss. Allow enough time to grieve before trying to get pregnant again.  SEEK IMMEDIATE MEDICAL CARE IF:   You have severe cramps or pain in your back or abdomen.  You have a fever.  You pass large blood clots (walnut-sized   or larger) ortissue from your vagina. Save any tissue for your caregiver to inspect.   Your bleeding increases.   You have a thick, bad-smelling vaginal discharge.  You become lightheaded, weak, or you faint.   You have chills.  MAKE SURE YOU:  Understand these instructions.  Will watch your condition.  Will get help right away if you are not doing well or get  worse. Document Released: 12/17/2000 Document Revised: 10/18/2012 Document Reviewed: 08/12/2011 [employer] Patient Information 2014 [employer].  

## 2013-12-21 ENCOUNTER — Encounter: Payer: Self-pay | Admitting: Obstetrics and Gynecology

## 2013-12-21 ENCOUNTER — Telehealth: Payer: Self-pay | Admitting: *Deleted

## 2013-12-21 DIAGNOSIS — O039 Complete or unspecified spontaneous abortion without complication: Secondary | ICD-10-CM | POA: Insufficient documentation

## 2013-12-21 LAB — HCG, QUANTITATIVE, PREGNANCY: HCG, BETA CHAIN, QUANT, S: 25605.9 m[IU]/mL

## 2013-12-21 LAB — ABO AND RH: RH TYPE: POSITIVE

## 2013-12-21 MED ORDER — MISOPROSTOL 200 MCG PO TABS: ORAL_TABLET | ORAL | Status: DC

## 2013-12-21 MED ORDER — PROMETHAZINE HCL 12.5 MG PO TABS: 12.5000 mg | ORAL_TABLET | Freq: Four times a day (QID) | ORAL | Status: DC | PRN

## 2013-12-21 MED ORDER — TRAMADOL HCL 50 MG PO TABS: 50.0000 mg | ORAL_TABLET | Freq: Four times a day (QID) | ORAL | Status: DC | PRN

## 2013-12-21 MED ORDER — IBUPROFEN 600 MG PO TABS: 600.0000 mg | ORAL_TABLET | Freq: Four times a day (QID) | ORAL | Status: AC | PRN

## 2013-12-21 NOTE — Telephone Encounter (Addendum)
6/17  1701  Pt called while at the pharmacy to pick up her medication. She stated that they will not give her all of the medication. I advised pt that I will talk with the pharmacist and call her back. I talked with Joe and he said that medicaid has implemented a new policy which started this month. It prohibits pts from receiving Losartan without prior authorization. Pt could also have a trial of an ACE inhibitor first and then if does not have good response, the Losartan would be approved. I consulted with Deirdre and she stated that pt should have Losartan. I called the pt back and explained the situation. I will get the medication authorized tomorrow. If there are any problems or changes I will call her back. I confirmed that she received the amlodipine and will begin taking that medication.  Pt stated that she did receive the amlodipine. She voiced understanding of all information given.  6/18  1645  Application for prior auth of Losartan through Medicaid ([place]) completed. I was informed that it will take up to 24 hrs for approval determination. Conf#  [id number] 6/22  1300  Approval of Losartan confirmed- pt has been approved for 1 year (12/22/13-6/12-16). I called pt and left message on her voice mail that she may pick up her medication today and is approved for refills until 12/17/14. She may call back if she has any questions.

## 2014-01-05 ENCOUNTER — Ambulatory Visit: Payer: Medicaid Other | Admitting: Obstetrics & Gynecology

## 2014-05-08 ENCOUNTER — Encounter: Payer: Self-pay | Admitting: Obstetrics and Gynecology

## 2014-08-14 ENCOUNTER — Other Ambulatory Visit: Payer: Self-pay | Admitting: Obstetrics and Gynecology

## 2014-09-20 ENCOUNTER — Encounter (HOSPITAL_COMMUNITY): Payer: Self-pay | Admitting: *Deleted

## 2015-04-13 ENCOUNTER — Emergency Department (HOSPITAL_BASED_OUTPATIENT_CLINIC_OR_DEPARTMENT_OTHER)
Admission: EM | Admit: 2015-04-13 | Discharge: 2015-04-13 | Disposition: A | Discharge status: Home / Self Care | Payer: Medicaid Other | Attending: Emergency Medicine | Admitting: Emergency Medicine

## 2015-04-13 ENCOUNTER — Encounter (HOSPITAL_BASED_OUTPATIENT_CLINIC_OR_DEPARTMENT_OTHER): Payer: Self-pay

## 2015-04-13 DIAGNOSIS — L03011 Cellulitis of right finger: Secondary | ICD-10-CM

## 2015-04-13 DIAGNOSIS — R109 Unspecified abdominal pain: Secondary | ICD-10-CM

## 2015-04-13 DIAGNOSIS — I10 Essential (primary) hypertension: Secondary | ICD-10-CM

## 2015-04-13 DIAGNOSIS — E119 Type 2 diabetes mellitus without complications: Secondary | ICD-10-CM

## 2015-04-13 DIAGNOSIS — O26899 Other specified pregnancy related conditions, unspecified trimester: Secondary | ICD-10-CM

## 2015-04-13 DIAGNOSIS — Z91040 Latex allergy status: Secondary | ICD-10-CM | POA: Insufficient documentation

## 2015-04-13 DIAGNOSIS — Z760 Encounter for issue of repeat prescription: Secondary | ICD-10-CM

## 2015-04-13 LAB — CBG MONITORING, ED: GLUCOSE-CAPILLARY: 277 mg/dL — AB (ref 65–99)

## 2015-04-13 MED ORDER — IBUPROFEN 400 MG PO TABS
600.0000 mg | ORAL_TABLET | Freq: Once | ORAL | Status: AC
  Administered 2015-04-13: 600 mg via ORAL
  Filled 2015-04-13 (×2): qty 1

## 2015-04-13 MED ORDER — LOSARTAN POTASSIUM 50 MG PO TABS: 50.0000 mg | ORAL_TABLET | Freq: Every day | ORAL | Status: DC

## 2015-04-13 MED ORDER — CEPHALEXIN 500 MG PO CAPS: 500.0000 mg | ORAL_CAPSULE | Freq: Four times a day (QID) | ORAL | Status: DC

## 2015-04-13 MED ORDER — METFORMIN HCL 1000 MG PO TABS: 1000.0000 mg | ORAL_TABLET | Freq: Two times a day (BID) | ORAL | Status: DC

## 2015-04-13 MED ORDER — AMLODIPINE BESYLATE 10 MG PO TABS: 10.0000 mg | ORAL_TABLET | Freq: Every day | ORAL | Status: DC

## 2015-04-13 NOTE — ED Provider Notes (Signed)
CSN: [id number]     Arrival date & time 04/13/15  1050 History   First MD Initiated Contact with Patient 04/13/15 1102     Chief Complaint  Patient presents with  . Nail Problem     (Consider location/radiation/quality/duration/timing/severity/associated sxs/prior Treatment) HPI Comments: Patient is a 39 year old female who presents with complaints of right ring finger pain. This is worsened over the past several days. There is swelling adjacent to the nail. She denies any fevers or chills. She does report that she is out of her blood pressure medication  The history is provided by the patient.    Past Medical History  Diagnosis Date  . Hypertension   . Diabetes mellitus (HCC)    History reviewed. No pertinent past surgical history. History reviewed. No pertinent family history. Social History  Substance Use Topics  . Smoking status: Never Smoker   . Smokeless tobacco: None  . Alcohol Use: No   OB History    Gravida Para Term Preterm AB TAB SAB Ectopic Multiple Living   3 0 0 0 2 0 2 0 0      Review of Systems  All other systems reviewed and are negative.     Allergies  Latex  Home Medications   Prior to Admission medications   Medication Sig Start Date End Date Taking? Authorizing Provider  ibuprofen (ADVIL,MOTRIN) 600 MG tablet Take 1 tablet (600 mg total) by mouth every 6 (six) hours as needed. 12/21/13  Yes Deirdre C Poe, CNM  amLODipine (NORVASC) 10 MG tablet Take 1 tablet (10 mg total) by mouth daily. 04/13/15   Geoffery Lyons, MD  cephALEXin (KEFLEX) 500 MG capsule Take 1 capsule (500 mg total) by mouth 4 (four) times daily. 04/13/15   Geoffery Lyons, MD  Cholecalciferol (VITAMIN D3) 1000 UNITS CAPS Take 1 capsule by mouth daily.    Historical Provider, MD  losartan (COZAAR) 50 MG tablet Take 1 tablet (50 mg total) by mouth daily. 04/13/15   Geoffery Lyons, MD  metFORMIN (GLUCOPHAGE) 1000 MG tablet Take 1 tablet (1,000 mg total) by mouth 2 (two) times daily with a  meal. 04/13/15   Geoffery Lyons, MD  misoprostol (CYTOTEC) 200 MCG tablet Place all 4 tablets at once deep in vagina 12/21/13   Deirdre Colin Mulders, CNM  Prenatal Vit-Fe Fumarate-FA (PRENATAL MULTIVITAMIN) TABS tablet Take 1 tablet by mouth daily at 12 noon.    Historical Provider, MD  promethazine (PHENERGAN) 12.5 MG tablet Take 1 tablet (12.5 mg total) by mouth every 6 (six) hours as needed for nausea or vomiting. 12/21/13   Deirdre Colin Mulders, CNM  traMADol (ULTRAM) 50 MG tablet Take 1 tablet (50 mg total) by mouth every 6 (six) hours as needed. 12/21/13   Deirdre C Poe, CNM   BP 224/110 mmHg  Pulse 105  Temp(Src) 97.9 F (36.6 C) (Oral)  SpO2 99%  LMP 04/06/2015 Physical Exam  Constitutional: She is oriented to person, place, and time. She appears well-developed and well-nourished. No distress.  HENT:  Head: Normocephalic and atraumatic.  Neck: Normal range of motion. Neck supple.  Cardiovascular: Normal rate and regular rhythm.  Exam reveals no gallop and no friction rub.   No murmur heard. Pulmonary/Chest: Effort normal and breath sounds normal. No respiratory distress. She has no wheezes.  Abdominal: Soft. Bowel sounds are normal. She exhibits no distension. There is no tenderness.  Musculoskeletal: Normal range of motion.  Neurological: She is alert and oriented to person, place, and time.  Skin: Skin  is warm and dry. She is not diaphoretic.  There is an area of swelling and tenderness adjacent to the nail of the right ring finger. There is purulent material noted under the skin in this area.  Nursing note and vitals reviewed.   ED Course  Procedures (including critical care time) Labs Review Labs Reviewed  CBG MONITORING, ED - Abnormal; Notable for the following:    Glucose-Capillary 277 (*)    All other components within normal limits    Imaging Review No results found. I have personally reviewed and evaluated these images and lab results as part of my medical decision-making.   EKG  Interpretation None      MDM   Final diagnoses:  Paronychia, right  Medication refill  Essential hypertension    An incision of the paronychia was performed with expression of purulent material. She will be treated with Keflex, warm soaks, and when necessary return. She is asked for and was provided with refills of her prescription medications. She has been off blood pressure medications and her blood pressure was initially elevated in the emergency department, however on my check has improved prior to discharge.    Geoffery Lyons, MD 04/13/15 1455

## 2015-04-13 NOTE — ED Notes (Signed)
CBG 277  

## 2015-04-13 NOTE — Discharge Instructions (Signed)
Keflex as prescribed.  Apply warm soaks as frequently as possible for the next several days.  Return to the ER if symptoms significantly worsen or change.   Fingertip Infection When an infection is around the nail, it is called a paronychia. When it appears over the tip of the finger, it is called a felon. These infections are due to minor injuries or cracks in the skin. If they are not treated properly, they can lead to bone infection and permanent damage to the fingernail. Incision and drainage is necessary if a pus pocket (an abscess) has formed. Antibiotics and pain medicine may also be needed. Keep your hand elevated for the next 2-3 days to reduce swelling and pain. If a pack was placed in the abscess, it should be removed in 1-2 days by your caregiver. Soak the finger in warm water for 20 minutes 4 times daily to help promote drainage. Keep the hands as dry as possible. Wear protective gloves with cotton liners. See your caregiver for follow-up care as recommended.  HOME CARE INSTRUCTIONS   Keep wound clean, dry and dressed as suggested by your caregiver.  Soak in warm salt water for fifteen minutes, four times per day for bacterial infections.  Your caregiver will prescribe an antibiotic if a bacterial infection is suspected. Take antibiotics as directed and finish the prescription, even if the problem appears to be improving before the medicine is gone.  Only take over-the-counter or prescription medicines for pain, discomfort, or fever as directed by your caregiver. SEEK IMMEDIATE MEDICAL CARE IF:  There is redness, swelling, or increasing pain in the wound.  Pus or any other unusual drainage is coming from the wound.  An unexplained oral temperature above 102 F (38.9 C) develops.  You notice a foul smell coming from the wound or dressing. MAKE SURE YOU:   Understand these instructions.  Monitor your condition.  Contact your caregiver if you are getting worse or not  improving.   This information is not intended to replace advice given to you by your health care provider. Make sure you discuss any questions you have with your health care provider.   Document Released: 07/31/2004 Document Revised: 09/15/2011 Document Reviewed: 12/11/2014 [employer] Interactive Patient Education [employer]

## 2015-04-13 NOTE — ED Notes (Signed)
Pt reports tip of right 4th digit next to nail area has pain, swelling, redness, white colored center, abscess formation x2 days. Pt reports she is out of her BP meds, DM meds.

## 2015-05-28 ENCOUNTER — Encounter: Payer: Self-pay | Admitting: Family Medicine

## 2015-05-28 ENCOUNTER — Ambulatory Visit (INDEPENDENT_AMBULATORY_CARE_PROVIDER_SITE_OTHER): Payer: Self-pay | Admitting: Family Medicine

## 2015-05-28 VITALS — BP 150/92 | HR 97 | Temp 98.5°F | Resp 16 | Ht 59.0 in | Wt 180.0 lb

## 2015-05-28 DIAGNOSIS — N760 Acute vaginitis: Secondary | ICD-10-CM

## 2015-05-28 DIAGNOSIS — I10 Essential (primary) hypertension: Secondary | ICD-10-CM

## 2015-05-28 DIAGNOSIS — A499 Bacterial infection, unspecified: Secondary | ICD-10-CM

## 2015-05-28 DIAGNOSIS — L298 Other pruritus: Secondary | ICD-10-CM

## 2015-05-28 DIAGNOSIS — O26899 Other specified pregnancy related conditions, unspecified trimester: Secondary | ICD-10-CM

## 2015-05-28 DIAGNOSIS — E119 Type 2 diabetes mellitus without complications: Secondary | ICD-10-CM

## 2015-05-28 DIAGNOSIS — N898 Other specified noninflammatory disorders of vagina: Secondary | ICD-10-CM

## 2015-05-28 DIAGNOSIS — R109 Unspecified abdominal pain: Secondary | ICD-10-CM

## 2015-05-28 DIAGNOSIS — B9689 Other specified bacterial agents as the cause of diseases classified elsewhere: Secondary | ICD-10-CM

## 2015-05-28 DIAGNOSIS — L83 Acanthosis nigricans: Secondary | ICD-10-CM

## 2015-05-28 DIAGNOSIS — O9989 Other specified diseases and conditions complicating pregnancy, childbirth and the puerperium: Secondary | ICD-10-CM

## 2015-05-28 LAB — COMPLETE METABOLIC PANEL WITH GFR
ALBUMIN: 4.2 g/dL (ref 3.6–5.1)
ALT: 14 U/L (ref 6–29)
AST: 15 U/L (ref 10–30)
Alkaline Phosphatase: 76 U/L (ref 33–115)
BILIRUBIN TOTAL: 0.5 mg/dL (ref 0.2–1.2)
BUN: 10 mg/dL (ref 7–25)
CO2: 24 mmol/L (ref 20–31)
CREATININE: 0.79 mg/dL (ref 0.50–1.10)
Calcium: 10 mg/dL (ref 8.6–10.2)
Chloride: 99 mmol/L (ref 98–110)
GFR, Est African American: >89 mL/min (ref >=60)
GFR, Est Non African American: >89 mL/min (ref >=60)
GLUCOSE: 188 mg/dL — AB (ref 65–99)
Potassium: 3.6 mmol/L (ref 3.5–5.3)
SODIUM: 136 mmol/L (ref 135–146)
TOTAL PROTEIN: 8.8 g/dL — AB (ref 6.1–8.1)

## 2015-05-28 LAB — POCT URINALYSIS DIP (DEVICE)
BILIRUBIN URINE: NEGATIVE
GLUCOSE, UA: 500 mg/dL — AB
Ketones, ur: NEGATIVE mg/dL
Leukocytes, UA: NEGATIVE
NITRITE: NEGATIVE
PH: 6.5 (ref 5.0–8.0)
PROTEIN: 100 mg/dL — AB
Specific Gravity, Urine: 1.015 (ref 1.005–1.030)
Urobilinogen, UA: 0.2 mg/dL (ref 0.0–1.0)

## 2015-05-28 LAB — CBC WITH DIFFERENTIAL/PLATELET
BASOS PCT: 0 % (ref 0–1)
Basophils Absolute: 0 10*3/uL (ref 0.0–0.1)
EOS ABS: 0.2 10*3/uL (ref 0.0–0.7)
EOS PCT: 2 % (ref 0–5)
HCT: 44.7 % (ref 36.0–46.0)
Hemoglobin: 15.1 g/dL — ABNORMAL HIGH (ref 12.0–15.0)
Lymphocytes Relative: 35 % (ref 12–46)
Lymphs Abs: 2.6 10*3/uL (ref 0.7–4.0)
MCH: 28.9 pg (ref 26.0–34.0)
MCHC: 33.8 g/dL (ref 30.0–36.0)
MCV: 85.6 fL (ref 78.0–100.0)
MONO ABS: 0.6 10*3/uL (ref 0.1–1.0)
MONOS PCT: 8 % (ref 3–12)
MPV: 10.8 fL (ref 8.6–12.4)
Neutro Abs: 4.1 10*3/uL (ref 1.7–7.7)
Neutrophils Relative %: 55 % (ref 43–77)
Platelets: 402 10*3/uL — ABNORMAL HIGH (ref 150–400)
RBC: 5.22 MIL/uL — AB (ref 3.87–5.11)
RDW: 14.9 % (ref 11.5–15.5)
WBC: 7.5 10*3/uL (ref 4.0–10.5)

## 2015-05-28 LAB — POCT WET + KOH PREP
Trich by wet prep: ABSENT
YEAST BY KOH: ABSENT
YEAST BY WET PREP: ABSENT

## 2015-05-28 LAB — HEMOGLOBIN A1C
HEMOGLOBIN A1C: 11.5 % — AB (ref <5.7)
MEAN PLASMA GLUCOSE: 283 mg/dL — AB (ref <117)

## 2015-05-28 LAB — GLUCOSE, CAPILLARY: GLUCOSE-CAPILLARY: 178 mg/dL — AB (ref 65–99)

## 2015-05-28 MED ORDER — METRONIDAZOLE 500 MG PO TABS: 500.0000 mg | ORAL_TABLET | Freq: Two times a day (BID) | ORAL | Status: AC

## 2015-05-28 MED ORDER — AMLODIPINE BESYLATE 10 MG PO TABS: 10.0000 mg | ORAL_TABLET | Freq: Every day | ORAL | Status: DC

## 2015-05-28 MED ORDER — METFORMIN HCL 1000 MG PO TABS: 1000.0000 mg | ORAL_TABLET | Freq: Two times a day (BID) | ORAL | Status: DC

## 2015-05-28 MED ORDER — CLONIDINE HCL 0.1 MG PO TABS
0.1000 mg | ORAL_TABLET | Freq: Once | ORAL | Status: AC
  Administered 2015-05-28: 0.1 mg via ORAL

## 2015-05-28 MED ORDER — LOSARTAN POTASSIUM 50 MG PO TABS: 50.0000 mg | ORAL_TABLET | Freq: Every day | ORAL | Status: DC

## 2015-05-28 NOTE — Patient Instructions (Signed)
Bacterial Vaginosis Bacterial vaginosis is a vaginal infection that occurs when the normal balance of bacteria in the vagina is disrupted. It results from an overgrowth of certain bacteria. This is the most common vaginal infection in women of childbearing age. Treatment is important to prevent complications, especially in pregnant women, as it can cause a premature delivery. CAUSES  Bacterial vaginosis is caused by an increase in harmful bacteria that are normally present in smaller amounts in the vagina. Several different kinds of bacteria can cause bacterial vaginosis. However, the reason that the condition develops is not fully understood. RISK FACTORS Certain activities or behaviors can put you at an increased risk of developing bacterial vaginosis, including:  Having a new sex partner or multiple sex partners.  Douching.  Using an intrauterine device (IUD) for contraception. Women do not get bacterial vaginosis from toilet seats, bedding, swimming pools, or contact with objects around them. SIGNS AND SYMPTOMS  Some women with bacterial vaginosis have no signs or symptoms. Common symptoms include:  Grey vaginal discharge.  A fishlike odor with discharge, especially after sexual intercourse.  Itching or burning of the vagina and vulva.  Burning or pain with urination. DIAGNOSIS  Your health care provider will take a medical history and examine the vagina for signs of bacterial vaginosis. A sample of vaginal fluid may be taken. Your health care provider will look at this sample under a microscope to check for bacteria and abnormal cells. A vaginal pH test may also be done.  TREATMENT  Bacterial vaginosis may be treated with antibiotic medicines. These may be given in the form of a pill or a vaginal cream. A second round of antibiotics may be prescribed if the condition comes back after treatment. Because bacterial vaginosis increases your risk for sexually transmitted diseases, getting  treated can help reduce your risk for chlamydia, gonorrhea, HIV, and herpes. HOME CARE INSTRUCTIONS   Only take over-the-counter or prescription medicines as directed by your health care provider.  If antibiotic medicine was prescribed, take it as directed. Make sure you finish it even if you start to feel better.  Tell all sexual partners that you have a vaginal infection. They should see their health care provider and be treated if they have problems, such as a mild rash or itching.  During treatment, it is important that you follow these instructions:  Avoid sexual activity or use condoms correctly.  Do not douche.  Avoid alcohol as directed by your health care provider.  Avoid breastfeeding as directed by your health care provider. SEEK MEDICAL CARE IF:   Your symptoms are not improving after 3 days of treatment.  You have increased discharge or pain.  You have a fever. MAKE SURE YOU:   Understand these instructions.  Will watch your condition.  Will get help right away if you are not doing well or get worse. FOR MORE INFORMATION  Centers for Disease Control and Prevention, Division of STD Prevention: SolutionApps.co.zawww.cdc.gov/std American Sexual Health Association (ASHA): www.ashastd.org    This information is not intended to replace advice given to you by your health care provider. Make sure you discuss any questions you have with your health care provider.   Document Released: 06/23/2005 Document Revised: 07/14/2014 Document Reviewed: 02/02/2013 Elsevier Interactive Patient Education 2016 [employer] Diabetes and Exercise Exercising regularly is important. It is not just about losing weight. It has many health benefits, such as:  Improving your overall fitness, flexibility, and endurance.  Increasing your bone density.  Helping with  weight control.  Decreasing your body fat.  Increasing your muscle strength.  Reducing stress and tension.  Improving your overall  health. People with diabetes who exercise gain additional benefits because exercise:  Reduces appetite.  Improves the body's use of blood sugar (glucose).  Helps lower or control blood glucose.  Decreases blood pressure.  Helps control blood lipids (such as cholesterol and triglycerides).  Improves the body's use of the hormone insulin by:  Increasing the body's insulin sensitivity.  Reducing the body's insulin needs.  Decreases the risk for heart disease because exercising:  Lowers cholesterol and triglycerides levels.  Increases the levels of good cholesterol (such as high-density lipoproteins [HDL]) in the body.  Lowers blood glucose levels. YOUR ACTIVITY PLAN  Choose an activity that you enjoy, and set realistic goals. To exercise safely, you should begin practicing any new physical activity slowly, and gradually increase the intensity of the exercise over time. Your health care provider or diabetes educator can help create an activity plan that works for you. General recommendations include:  Encouraging children to engage in at least 60 minutes of physical activity each day.  Stretching and performing strength training exercises, such as yoga or weight lifting, at least 2 times per week.  Performing a total of at least 150 minutes of moderate-intensity exercise each week, such as brisk walking or water aerobics.  Exercising at least 3 days per week, making sure you allow no more than 2 consecutive days to pass without exercising.  Avoiding long periods of inactivity (90 minutes or more). When you have to spend an extended period of time sitting down, take frequent breaks to walk or stretch. RECOMMENDATIONS FOR EXERCISING WITH TYPE 1 OR TYPE 2 DIABETES   Check your blood glucose before exercising. If blood glucose levels are greater than 240 mg/dL, check for urine ketones. Do not exercise if ketones are present.  Avoid injecting insulin into areas of the body that are  going to be exercised. For example, avoid injecting insulin into:  The arms when playing tennis.  The legs when jogging.  Keep a record of:  Food intake before and after you exercise.  Expected peak times of insulin action.  Blood glucose levels before and after you exercise.  The type and amount of exercise you have done.  Review your records with your health care provider. Your health care provider will help you to develop guidelines for adjusting food intake and insulin amounts before and after exercising.  If you take insulin or oral hypoglycemic agents, watch for signs and symptoms of hypoglycemia. They include:  Dizziness.  Shaking.  Sweating.  Chills.  Confusion.  Drink plenty of water while you exercise to prevent dehydration or heat stroke. Body water is lost during exercise and must be replaced.  Talk to your health care provider before starting an exercise program to make sure it is safe for you. Remember, almost any type of activity is better than none.   This information is not intended to replace advice given to you by your health care provider. Make sure you discuss any questions you have with your health care provider.   Document Released: 09/13/2003 Document Revised: 11/07/2014 Document Reviewed: 11/30/2012 Elsevier Interactive Patient Education 2016 [employer]. Diabetes Mellitus and Food It is important for you to manage your blood sugar (glucose) level. Your blood glucose level can be greatly affected by what you eat. Eating healthier foods in the appropriate amounts throughout the day at about the same time each  day will help you control your blood glucose level. It can also help slow or prevent worsening of your diabetes mellitus. Healthy eating may even help you improve the level of your blood pressure and reach or maintain a healthy weight.  General recommendations for healthful eating and cooking habits include:  Eating meals and snacks regularly.  Avoid going long periods of time without eating to lose weight.  Eating a diet that consists mainly of plant-based foods, such as fruits, vegetables, nuts, legumes, and whole grains.  Using low-heat cooking methods, such as baking, instead of high-heat cooking methods, such as deep frying. Work with your dietitian to make sure you understand how to use the Nutrition Facts information on food labels. HOW CAN FOOD AFFECT ME? Carbohydrates Carbohydrates affect your blood glucose level more than any other type of food. Your dietitian will help you determine how many carbohydrates to eat at each meal and teach you how to count carbohydrates. Counting carbohydrates is important to keep your blood glucose at a healthy level, especially if you are using insulin or taking certain medicines for diabetes mellitus. Alcohol Alcohol can cause sudden decreases in blood glucose (hypoglycemia), especially if you use insulin or take certain medicines for diabetes mellitus. Hypoglycemia can be a life-threatening condition. Symptoms of hypoglycemia (sleepiness, dizziness, and disorientation) are similar to symptoms of having too much alcohol.  If your health care provider has given you approval to drink alcohol, do so in moderation and use the following guidelines:  Women should not have more than one drink per day, and men should not have more than two drinks per day. One drink is equal to:  12 oz of beer.  5 oz of wine.  1 oz of hard liquor.  Do not drink on an empty stomach.  Keep yourself hydrated. Have water, diet soda, or unsweetened iced tea.  Regular soda, juice, and other mixers might contain a lot of carbohydrates and should be counted. WHAT FOODS ARE NOT RECOMMENDED? As you make food choices, it is important to remember that all foods are not the same. Some foods have fewer nutrients per serving than other foods, even though they might have the same number of calories or carbohydrates. It is  difficult to get your body what it needs when you eat foods with fewer nutrients. Examples of foods that you should avoid that are high in calories and carbohydrates but low in nutrients include:  Trans fats (most processed foods list trans fats on the Nutrition Facts label).  Regular soda.  Juice.  Candy.  Sweets, such as cake, pie, doughnuts, and cookies.  Fried foods. WHAT FOODS CAN I EAT? Eat nutrient-rich foods, which will nourish your body and keep you healthy. The food you should eat also will depend on several factors, including:  The calories you need.  The medicines you take.  Your weight.  Your blood glucose level.  Your blood pressure level.  Your cholesterol level. You should eat a variety of foods, including:  Protein.  Lean cuts of meat.  Proteins low in saturated fats, such as fish, egg whites, and beans. Avoid processed meats.  Fruits and vegetables.  Fruits and vegetables that may help control blood glucose levels, such as apples, mangoes, and yams.  Dairy products.  Choose fat-free or low-fat dairy products, such as milk, yogurt, and cheese.  Grains, bread, pasta, and rice.  Choose whole grain products, such as multigrain bread, whole oats, and brown rice. These foods may help control blood  pressure.  Fats.  Foods containing healthful fats, such as nuts, avocado, olive oil, canola oil, and fish. DOES EVERYONE WITH DIABETES MELLITUS HAVE THE SAME MEAL PLAN? Because every person with diabetes mellitus is different, there is not one meal plan that works for everyone. It is very important that you meet with a dietitian who will help you create a meal plan that is just right for you.   This information is not intended to replace advice given to you by your health care provider. Make sure you discuss any questions you have with your health care provider.   Document Released: 03/20/2005 Document Revised: 07/14/2014 Document Reviewed:  05/20/2013 Elsevier Interactive Patient Education 2016 Elsevier Inc. DASH Eating Plan DASH stands for "Dietary Approaches to Stop Hypertension." The DASH eating plan is a healthy eating plan that has been shown to reduce high blood pressure (hypertension). Additional health benefits may include reducing the risk of type 2 diabetes mellitus, heart disease, and stroke. The DASH eating plan may also help with weight loss. WHAT DO I NEED TO KNOW ABOUT THE DASH EATING PLAN? For the DASH eating plan, you will follow these general guidelines:  Choose foods with a percent daily value for sodium of less than 5% (as listed on the food label).  Use salt-free seasonings or herbs instead of table salt or sea salt.  Check with your health care provider or pharmacist before using salt substitutes.  Eat lower-sodium products, often labeled as "lower sodium" or "no salt added."  Eat fresh foods.  Eat more vegetables, fruits, and low-fat dairy products.  Choose whole grains. Look for the word "whole" as the first word in the ingredient list.  Choose fish and skinless chicken or [place] more often than red meat. Limit fish, poultry, and meat to 6 oz (170 g) each day.  Limit sweets, desserts, sugars, and sugary drinks.  Choose heart-healthy fats.  Limit cheese to 1 oz (28 g) per day.  Eat more home-cooked food and less restaurant, buffet, and fast food.  Limit fried foods.  Cook foods using methods other than frying.  Limit canned vegetables. If you do use them, rinse them well to decrease the sodium.  When eating at a restaurant, ask that your food be prepared with less salt, or no salt if possible. WHAT FOODS CAN I EAT? Seek help from a dietitian for individual calorie needs. Grains Whole grain or whole wheat bread. Brown rice. Whole grain or whole wheat pasta. Quinoa, bulgur, and whole grain cereals. Low-sodium cereals. Corn or whole wheat flour tortillas. Whole grain cornbread. Whole grain  crackers. Low-sodium crackers. Vegetables Fresh or frozen vegetables (raw, steamed, roasted, or grilled). Low-sodium or reduced-sodium tomato and vegetable juices. Low-sodium or reduced-sodium tomato sauce and paste. Low-sodium or reduced-sodium canned vegetables.  Fruits All fresh, canned (in natural juice), or frozen fruits. Meat and Other Protein Products Ground beef (85% or leaner), grass-fed beef, or beef trimmed of fat. Skinless chicken or [place]. Ground chicken or [place]. Pork trimmed of fat. All fish and seafood. Eggs. Dried beans, peas, or lentils. Unsalted nuts and seeds. Unsalted canned beans. Dairy Low-fat dairy products, such as skim or 1% milk, 2% or reduced-fat cheeses, low-fat ricotta or cottage cheese, or plain low-fat yogurt. Low-sodium or reduced-sodium cheeses. Fats and Oils Tub margarines without trans fats. Light or reduced-fat mayonnaise and salad dressings (reduced sodium). Avocado. Safflower, olive, or canola oils. Natural peanut or almond butter. Other Unsalted popcorn and pretzels. The items listed above may not be  a complete list of recommended foods or beverages. Contact your dietitian for more options. WHAT FOODS ARE NOT RECOMMENDED? Grains White bread. White pasta. White rice. Refined cornbread. Bagels and croissants. Crackers that contain trans fat. Vegetables Creamed or fried vegetables. Vegetables in a cheese sauce. Regular canned vegetables. Regular canned tomato sauce and paste. Regular tomato and vegetable juices. Fruits Dried fruits. Canned fruit in light or heavy syrup. Fruit juice. Meat and Other Protein Products Fatty cuts of meat. Ribs, chicken wings, bacon, sausage, bologna, salami, chitterlings, fatback, hot dogs, bratwurst, and packaged luncheon meats. Salted nuts and seeds. Canned beans with salt. Dairy Whole or 2% milk, cream, half-and-half, and cream cheese. Whole-fat or sweetened yogurt. Full-fat cheeses or blue cheese. Nondairy creamers and  whipped toppings. Processed cheese, cheese spreads, or cheese curds. Condiments Onion and garlic salt, seasoned salt, table salt, and sea salt. Canned and packaged gravies. Worcestershire sauce. Tartar sauce. Barbecue sauce. Teriyaki sauce. Soy sauce, including reduced sodium. Steak sauce. Fish sauce. Oyster sauce. Cocktail sauce. Horseradish. Ketchup and mustard. Meat flavorings and tenderizers. Bouillon cubes. Hot sauce. Tabasco sauce. Marinades. Taco seasonings. Relishes. Fats and Oils Butter, stick margarine, lard, shortening, ghee, and bacon fat. Coconut, palm kernel, or palm oils. Regular salad dressings. Other Pickles and olives. Salted popcorn and pretzels. The items listed above may not be a complete list of foods and beverages to avoid. Contact your dietitian for more information. WHERE CAN I FIND MORE INFORMATION? National Heart, Lung, and Blood Institute: CablePromo.it   This information is not intended to replace advice given to you by your health care provider. Make sure you discuss any questions you have with your health care provider.   Document Released: 06/12/2011 Document Revised: 07/14/2014 Document Reviewed: 04/27/2013 Elsevier Interactive Patient Education [employer]

## 2015-05-28 NOTE — Progress Notes (Signed)
Subjective:    Patient ID: Traci Warren, female    DOB: January 30, 1976, 39 y.o.   MRN: [id number]  HPI Traci Warren, a 39 year old female with a history of uncontrolled diabetes and hypertension presents to establish care. Traci Warren states that she has been without medications for greater than 2 weeks. She states that she does not exercise or follow a low fat/low carbohydrate diet. Patient does not check blood sugars at home. She states that diabetes was controlled with metformin twice daily.  Symptoms had stabilized prior to running out of medications.  Patient denies foot ulcerations, nausea, polydipsia, polyuria, visual disturbances, vomitting and weight loss.  Patient also has a history of hyper tension. She states that she was diagnosed several years ago. She does not check blood pressure at home. She denies dizziness, fatigue, chest palpitations,or swelling to lower extremities.  Cardiovascular risk factors include: obesity (BMI >= 30 kg/m2) and sedentary lifestyle.   Past Medical History  Diagnosis Date  . Hypertension   . Diabetes mellitus (HCC)    Immunization History  Administered Date(s) Administered  . Td 07/07/2009   Allergies  Allergen Reactions  . Latex    Social History   Social History  . Marital Status: Married    Spouse Name: N/A  . Number of Children: N/A  . Years of Education: N/A   Occupational History  . Not on file.   Social History Main Topics  . Smoking status: Never Smoker   . Smokeless tobacco: Not on file  . Alcohol Use: No  . Drug Use: No  . Sexual Activity: No   Other Topics Concern  . Not on file   Social History Narrative    Review of Systems  Constitutional: Negative.  Negative for fever, fatigue and unexpected weight change.  HENT: Negative.  Negative for congestion.   Eyes: Negative.  Negative for visual disturbance.  Respiratory: Negative.  Negative for cough.   Cardiovascular: Negative.   Gastrointestinal: Negative.    Endocrine: Negative.  Negative for polydipsia, polyphagia and polyuria.  Genitourinary: Negative.  Negative for vaginal discharge (vaginal itching).  Musculoskeletal: Negative.   Skin: Negative.   Allergic/Immunologic: Negative.   Neurological: Negative.  Negative for tremors, weakness and numbness.  Hematological: Negative.   Psychiatric/Behavioral: Negative.  Negative for suicidal ideas and sleep disturbance.        Objective:   Physical Exam  Constitutional: She is oriented to person, place, and time. She appears well-developed and well-nourished.  HENT:  Head: Normocephalic and atraumatic.  Right Ear: External ear normal.  Left Ear: External ear normal.  Mouth/Throat: Oropharynx is clear and moist.  Eyes: Conjunctivae and EOM are normal. Pupils are equal, round, and reactive to light.  Neck: Normal range of motion. Neck supple.  Cardiovascular: Normal rate, regular rhythm, normal heart sounds and intact distal pulses.   Pulmonary/Chest: Effort normal and breath sounds normal.  Abdominal: Soft. Bowel sounds are normal.  Musculoskeletal: Normal range of motion.  Neurological: She is alert and oriented to person, place, and time. She has normal strength and normal reflexes. No cranial nerve deficit or sensory deficit. She displays a negative Romberg sign.  Monofilament test negative  Skin: Skin is warm and dry.  Hirsutism to face Hyperpigmented skin thickening to neck  Psychiatric: She has a normal mood and affect. Her behavior is normal. Judgment and thought content normal.          BP 150/92 mmHg  Pulse 97  Temp(Src) 98.5  F (36.9 C) (Oral)  Resp 16  Ht 4\' 11"  (1.499 m)  Wt 180 lb (81.647 kg)  BMI 36.34 kg/m2  LMP 04/30/2015 Assessment & Plan:  1. Type 2 diabetes mellitus without complication, without long-term current use of insulin (HCC) Discussed diet and exercise at length. Patient has been without medications for over 2 weeks. Recommend a lowfat, low  carbohydrate diet divided over 5-6 small meals, increase water intake to 6-8 glasses, and 150 minutes per week of cardiovascular exercise.   - Glucose (CBG) - POCT urinalysis dipstick - COMPLETE METABOLIC PANEL WITH GFR - CBC with Differential - Hemoglobin A1c - metFORMIN (GLUCOPHAGE) 1000 MG tablet; Take 1 tablet (1,000 mg total) by mouth 2 (two) times daily with a meal.  Dispense: 60 tablet; Refill: 0 - losartan (COZAAR) 50 MG tablet; Take 1 tablet (50 mg total) by mouth daily.  Dispense: 30 tablet; Refill: 0  2. Essential hypertension Blood pressure was 190/92. Patient has been out of medications for greater than 1 week. Given Clonidine 0.1 mg in office, blood pressure decreased to 150/92. Will check urinalysis for  - POCT urinalysis dipstick - COMPLETE METABOLIC PANEL WITH GFR - cloNIDine (CATAPRES) tablet 0.1 mg; Take 1 tablet (0.1 mg total) by mouth once. - amLODipine (NORVASC) 10 MG tablet; Take 1 tablet (10 mg total) by mouth daily.  Dispense: 30 tablet; Refill: 0 - losartan (COZAAR) 50 MG tablet; Take 1 tablet (50 mg total) by mouth daily.  Dispense: 30 tablet; Refill: 0  3. Vaginal itching - POCT wet + KOH prep  5. Bacterial vaginosis 3-5 Clue cells per high power field. Patient given written information. Advised to refrain from drinking alcoholic beverages while taking metronidazole.  - metroNIDAZOLE (FLAGYL) 500 MG tablet; Take 1 tablet (500 mg total) by mouth 2 (two) times daily.  Dispense: 14 tablet; Refill: 0   6. Acanthosis nigricans Refer to #1.    RTC: 1 month for DMII and hypertension   The patient was given clear instructions to go to ER or return to medical center if symptoms do not improve, worsen or new problems develop. The patient verbalized understanding. Will notify patient with laboratory results.    Massie MaroonHollis,Edison Nicholson M, FNP

## 2015-05-29 ENCOUNTER — Telehealth: Payer: Self-pay | Admitting: Family Medicine

## 2015-05-29 DIAGNOSIS — E118 Type 2 diabetes mellitus with unspecified complications: Secondary | ICD-10-CM

## 2015-05-29 MED ORDER — SITAGLIPTIN PHOSPHATE 50 MG PO TABS: 50.0000 mg | ORAL_TABLET | Freq: Every day | ORAL | Status: DC

## 2015-05-29 NOTE — Telephone Encounter (Signed)
Reviewed labs, hemoglobin a1C has increased to 11%, patient has been without medications consistently over the past year. Will add Januvia to medication regimen along with low carbohydrate, low fat diet and exercise regimen. Will follow up in office in 1 month.  Meds ordered this encounter  Medications  . sitaGLIPtin (JANUVIA) 50 MG tablet    Sig: Take 1 tablet (50 mg total) by mouth daily.    Dispense:  30 tablet    Refill:  2     Mohd. Derflinger M, FNP

## 2015-07-04 ENCOUNTER — Ambulatory Visit (INDEPENDENT_AMBULATORY_CARE_PROVIDER_SITE_OTHER): Payer: Self-pay | Admitting: Family Medicine

## 2015-07-04 ENCOUNTER — Encounter: Payer: Self-pay | Admitting: Family Medicine

## 2015-07-04 DIAGNOSIS — R809 Proteinuria, unspecified: Secondary | ICD-10-CM

## 2015-07-04 DIAGNOSIS — E119 Type 2 diabetes mellitus without complications: Secondary | ICD-10-CM

## 2015-07-04 DIAGNOSIS — I10 Essential (primary) hypertension: Secondary | ICD-10-CM

## 2015-07-04 LAB — POCT URINALYSIS DIP (DEVICE)
BILIRUBIN URINE: NEGATIVE
Glucose, UA: 500 mg/dL — AB
Ketones, ur: NEGATIVE mg/dL
Leukocytes, UA: NEGATIVE
Nitrite: NEGATIVE
PROTEIN: 30 mg/dL — AB
SPECIFIC GRAVITY, URINE: 1.025 (ref 1.005–1.030)
Urobilinogen, UA: 0.2 mg/dL (ref 0.0–1.0)
pH: 6 (ref 5.0–8.0)

## 2015-07-04 LAB — MICROALBUMIN / CREATININE URINE RATIO
CREATININE, URINE: 169 mg/dL (ref 20–320)
Microalb Creat Ratio: 45 mcg/mg creat — ABNORMAL HIGH (ref <30)
Microalb, Ur: 7.6 mg/dL

## 2015-07-04 MED ORDER — AMLODIPINE BESYLATE 10 MG PO TABS: 10.0000 mg | ORAL_TABLET | Freq: Every day | ORAL | Status: DC

## 2015-07-04 MED ORDER — SITAGLIPTIN PHOSPHATE 50 MG PO TABS: 50.0000 mg | ORAL_TABLET | Freq: Every day | ORAL | Status: DC

## 2015-07-04 MED ORDER — LOSARTAN POTASSIUM 50 MG PO TABS: 50.0000 mg | ORAL_TABLET | Freq: Every day | ORAL | Status: DC

## 2015-07-04 MED ORDER — METFORMIN HCL 1000 MG PO TABS: 1000.0000 mg | ORAL_TABLET | Freq: Two times a day (BID) | ORAL | Status: DC

## 2015-07-04 NOTE — Progress Notes (Signed)
Subjective:    Patient ID: Traci Warren, female    DOB: 04/11/1976, 39 y.o.   MRN: [id number]  HPI Traci Warren, a 39 year old female with a history of uncontrolled diabetes and hypertension presents for a 1 month followup after restarting medication. Traci Warren states that she has been without antihypertensive medications for 4 days. She states that she does not exercise or follow a low fat/low carbohydrate diet. Patient does not check blood sugars consistently at home.  Symptoms had stabilized prior to running out of medications.  Patient denies foot ulcerations, nausea, polydipsia, polyuria, visual disturbances, vomitting and weight loss.  Patient also has a history of hypertension. She states that she was diagnosed several years ago. She does not check blood pressure at home. She denies dizziness, fatigue, chest palpitations,or swelling to lower extremities.  Cardiovascular risk factors include: obesity (BMI >= 30 kg/m2) and sedentary lifestyle.   Past Medical History  Diagnosis Date  . Hypertension   . Diabetes mellitus (HCC)    Immunization History  Administered Date(s) Administered  . Td 07/07/2009   Allergies  Allergen Reactions  . Latex    Social History   Social History  . Marital Status: Married    Spouse Name: N/A  . Number of Children: N/A  . Years of Education: N/A   Occupational History  . Not on file.   Social History Main Topics  . Smoking status: Never Smoker   . Smokeless tobacco: Not on file  . Alcohol Use: No  . Drug Use: No  . Sexual Activity: No   Other Topics Concern  . Not on file   Social History Narrative    Review of Systems  Constitutional: Negative.  Negative for fever, fatigue and unexpected weight change.  HENT: Negative.  Negative for congestion.   Eyes: Negative.  Negative for visual disturbance.  Respiratory: Negative.  Negative for cough.   Cardiovascular: Negative.   Gastrointestinal: Negative.   Endocrine:  Negative.  Negative for polydipsia, polyphagia and polyuria.  Genitourinary: Negative.  Negative for vaginal discharge (vaginal itching).  Musculoskeletal: Negative.   Skin: Negative.   Allergic/Immunologic: Negative.   Neurological: Negative.  Negative for tremors, weakness and numbness.  Hematological: Negative.   Psychiatric/Behavioral: Negative.  Negative for suicidal ideas and sleep disturbance.        Objective:   Physical Exam  Constitutional: She is oriented to person, place, and time. She appears well-developed and well-nourished.  HENT:  Head: Normocephalic and atraumatic.  Right Ear: External ear normal.  Left Ear: External ear normal.  Mouth/Throat: Oropharynx is clear and moist.  Eyes: Conjunctivae and EOM are normal. Pupils are equal, round, and reactive to light.  Neck: Normal range of motion. Neck supple.  Cardiovascular: Normal rate, regular rhythm, normal heart sounds and intact distal pulses.   Pulmonary/Chest: Effort normal and breath sounds normal.  Abdominal: Soft. Bowel sounds are normal.  Musculoskeletal: Normal range of motion.  Neurological: She is alert and oriented to person, place, and time. She has normal strength and normal reflexes. No cranial nerve deficit or sensory deficit. She displays a negative Romberg sign.  Monofilament test negative  Skin: Skin is warm and dry.  Hirsutism to face Hyperpigmented skin thickening to neck  Psychiatric: She has a normal mood and affect. Her behavior is normal. Judgment and thought content normal.     BP 162/90 mmHg  Pulse 97  Temp(Src) 98.1 F (36.7 C) (Oral)  Resp 16  Ht 4\' 11"  (  1.499 m)  Wt 179 lb (81.194 kg)  BMI 36.13 kg/m2  LMP 06/28/2015 Assessment & Plan:  1. Essential hypertension Blood pressure is above goal on current medication regimen. She has been without medication for 4 days. Patient to follow up in 1 week for a blood pressure check and 1 month f/u for hypertension. The patient is asked to  make an attempt to improve diet and exercise patterns to aid in medical management of this problem. - losartan (COZAAR) 50 MG tablet; Take 1 tablet (50 mg total) by mouth daily.  Dispense: 30 tablet; Refill: 2 - amLODipine (NORVASC) 10 MG tablet; Take 1 tablet (10 mg total) by mouth daily.  Dispense: 30 tablet; Refill: 2 - POCT urinalysis dipstick - POCT urinalysis dip (device)  2. Type 2 diabetes mellitus without complication, without long-term current use of insulin (HCC) - sitaGLIPtin (JANUVIA) 50 MG tablet; Take 1 tablet (50 mg total) by mouth daily.  Dispense: 30 tablet; Refill: 2 - metFORMIN (GLUCOPHAGE) 1000 MG tablet; Take 1 tablet (1,000 mg total) by mouth 2 (two) times daily with a meal.  Dispense: 60 tablet; Refill: 2 - POCT urinalysis dipstick - POCT urinalysis dip (device)  3. Proteinuria Reviewed urinalysis, proteinuria present. Will add a urine microalbumin for further evaluation.  - Microalbumin/Creatinine Ratio, Urine  RTC: 1 month for hypertension  The patient was given clear instructions to go to ER or return to medical center if symptoms do not improve, worsen or new problems develop. The patient verbalized understanding. Will notify patient with laboratory results. Massie Maroon, FNP

## 2015-07-04 NOTE — Patient Instructions (Addendum)
Basic Carbohydrate Counting for Diabetes Mellitus Carbohydrate counting is a method for keeping track of the amount of carbohydrates you eat. Eating carbohydrates naturally increases the level of sugar (glucose) in your blood, so it is important for you to know the amount that is okay for you to have in every meal. Carbohydrate counting helps keep the level of glucose in your blood within normal limits. The amount of carbohydrates allowed is different for every person. A dietitian can help you calculate the amount that is right for you. Once you know the amount of carbohydrates you can have, you can count the carbohydrates in the foods you want to eat. Carbohydrates are found in the following foods:  Grains, such as breads and cereals.  Dried beans and soy products.  Starchy vegetables, such as potatoes, peas, and corn.  Fruit and fruit juices.  Milk and yogurt.  Sweets and snack foods, such as cake, cookies, candy, chips, soft drinks, and fruit drinks. CARBOHYDRATE COUNTING There are two ways to count the carbohydrates in your food. You can use either of the methods or a combination of both. Reading the "Nutrition Facts" on Packaged Food The "Nutrition Facts" is an area that is included on the labels of almost all packaged food and beverages in the [place]. It includes the serving size of that food or beverage and information about the nutrients in each serving of the food, including the grams (g) of carbohydrate per serving.  Decide the number of servings of this food or beverage that you will be able to eat or drink. Multiply that number of servings by the number of grams of carbohydrate that is listed on the label for that serving. The total will be the amount of carbohydrates you will be having when you eat or drink this food or beverage. Learning Standard Serving Sizes of Food When you eat food that is not packaged or does not include "Nutrition Facts" on the label, you need to  measure the servings in order to count the amount of carbohydrates.A serving of most carbohydrate-rich foods contains about 15 g of carbohydrates. The following list includes serving sizes of carbohydrate-rich foods that provide 15 g ofcarbohydrate per serving:   1 slice of bread (1 oz) or 1 six-inch tortilla.    of a hamburger bun or English muffin.  4-6 crackers.   cup unsweetened dry cereal.    cup hot cereal.   cup rice or pasta.    cup mashed potatoes or  of a large baked potato.  1 cup fresh fruit or one small piece of fruit.    cup canned or frozen fruit or fruit juice.  1 cup milk.   cup plain fat-free yogurt or yogurt sweetened with artificial sweeteners.   cup cooked dried beans or starchy vegetable, such as peas, corn, or potatoes.  Decide the number of standard-size servings that you will eat. Multiply that number of servings by 15 (the grams of carbohydrates in that serving). For example, if you eat 2 cups of strawberries, you will have eaten 2 servings and 30 g of carbohydrates (2 servings x 15 g = 30 g). For foods such as soups and casseroles, in which more than one food is mixed in, you will need to count the carbohydrates in each food that is included. EXAMPLE OF CARBOHYDRATE COUNTING Sample Dinner  3 oz chicken breast.   cup of brown rice.   cup of corn.  1 cup milk.   1 cup strawberries with   sugar-free whipped topping.  Carbohydrate Calculation Step 1: Identify the foods that contain carbohydrates:   Rice.   Corn.   Milk.   Strawberries. Step 2:Calculate the number of servings eaten of each:   2 servings of rice.   1 serving of corn.   1 serving of milk.   1 serving of strawberries. Step 3: Multiply each of those number of servings by 15 g:   2 servings of rice x 15 g = 30 g.   1 serving of corn x 15 g = 15 g.   1 serving of milk x 15 g = 15 g.   1 serving of strawberries x 15 g = 15 g. Step 4: Add  together all of the amounts to find the total grams of carbohydrates eaten: 30 g + 15 g + 15 g + 15 g = 75 g.   This information is not intended to replace advice given to you by your health care provider. Make sure you discuss any questions you have with your health care provider.   Document Released: 06/23/2005 Document Revised: 07/14/2014 Document Reviewed: 05/20/2013 Elsevier Interactive Patient Education 2016 Else[employer] DASH Eating Plan DASH stands for "Dietary Approaches to Stop Hypertension." The DASH eating plan is a healthy eating plan that has been shown to reduce high blood pressure (hypertension). Additional health benefits may include reducing the risk of type 2 diabetes mellitus, heart disease, and stroke. The DASH eating plan may also help with weight loss. WHAT DO I NEED TO KNOW ABOUT THE DASH EATING PLAN? For the DASH eating plan, you will follow these general guidelines:  Choose foods with a percent daily value for sodium of less than 5% (as listed on the food label).  Use salt-free seasonings or herbs instead of table salt or sea salt.  Check with your health care provider or pharmacist before using salt substitutes.  Eat lower-sodium products, often labeled as "lower sodium" or "no salt added."  Eat fresh foods.  Eat more vegetables, fruits, and low-fat dairy products.  Choose whole grains. Look for the word "whole" as the first word in the ingredient list.  Choose fish and skinless chicken or turkey more often than red meat. Limit fish, poultry, and meat to 6 oz (170 g) each day.  Limit sweets, desserts, sugars, and sugary drinks.  Choose heart-healthy fats.  Limit cheese to 1 oz (28 g) per day.  Eat more home-cooked food and less restaurant, buffet, and fast food.  Limit fried foods.  Cook foods using methods other than frying.  Limit canned vegetables. If you do use them, rinse them well to decrease the sodium.  When eating at a restaurant, ask that  your food be prepared with less salt, or no salt if possible. WHAT FOODS CAN I EAT? Seek help from a dietitian for individual calorie needs. Grains Whole grain or whole wheat bread. Brown rice. Whole grain or whole wheat pasta. Quinoa, bulgur, and whole grain cereals. Low-sodium cereals. Corn or whole wheat flour tortillas. Whole grain cornbread. Whole grain crackers. Low-sodium crackers. Vegetables Fresh or frozen vegetables (raw, steamed, roasted, or grilled). Low-sodium or reduced-sodium tomato and vegetable juices. Low-sodium or reduced-sodium tomato sauce and paste. Low-sodium or reduced-sodium canned vegetables.  Fruits All fresh, canned (in natural juice), or frozen fruits. Meat and Other Protein Products Ground beef (85% or leaner), grass-fed beef, or beef trimmed of fat. Skinless chicken or turkey. Ground chicken or turkey. Pork trimmed of fat. All fish and seafood. Eggs.   Dried beans, peas, or lentils. Unsalted nuts and seeds. Unsalted canned beans. Dairy Low-fat dairy products, such as skim or 1% milk, 2% or reduced-fat cheeses, low-fat ricotta or cottage cheese, or plain low-fat yogurt. Low-sodium or reduced-sodium cheeses. Fats and Oils Tub margarines without trans fats. Light or reduced-fat mayonnaise and salad dressings (reduced sodium). Avocado. Safflower, olive, or canola oils. Natural peanut or almond butter. Other Unsalted popcorn and pretzels. The items listed above may not be a complete list of recommended foods or beverages. Contact your dietitian for more options. WHAT FOODS ARE NOT RECOMMENDED? Grains White bread. White pasta. White rice. Refined cornbread. Bagels and croissants. Crackers that contain trans fat. Vegetables Creamed or fried vegetables. Vegetables in a cheese sauce. Regular canned vegetables. Regular canned tomato sauce and paste. Regular tomato and vegetable juices. Fruits Dried fruits. Canned fruit in light or heavy syrup. Fruit juice. Meat and Other  Protein Products Fatty cuts of meat. Ribs, chicken wings, bacon, sausage, bologna, salami, chitterlings, fatback, hot dogs, bratwurst, and packaged luncheon meats. Salted nuts and seeds. Canned beans with salt. Dairy Whole or 2% milk, cream, half-and-half, and cream cheese. Whole-fat or sweetened yogurt. Full-fat cheeses or blue cheese. Nondairy creamers and whipped toppings. Processed cheese, cheese spreads, or cheese curds. Condiments Onion and garlic salt, seasoned salt, table salt, and sea salt. Canned and packaged gravies. Worcestershire sauce. Tartar sauce. Barbecue sauce. Teriyaki sauce. Soy sauce, including reduced sodium. Steak sauce. Fish sauce. Oyster sauce. Cocktail sauce. Horseradish. Ketchup and mustard. Meat flavorings and tenderizers. Bouillon cubes. Hot sauce. Tabasco sauce. Marinades. Taco seasonings. Relishes. Fats and Oils Butter, stick margarine, lard, shortening, ghee, and bacon fat. Coconut, palm kernel, or palm oils. Regular salad dressings. Other Pickles and olives. Salted popcorn and pretzels. The items listed above may not be a complete list of foods and beverages to avoid. Contact your dietitian for more information. WHERE CAN I FIND MORE INFORMATION? National Heart, Lung, and [employer] Institute: www.nhlbi.nih.gov/health/health-topics/topics/dash/   This information is not intended to replace advice given to you by your health care provider. Make sure you discuss any questions you have with your health care provider.   Document Released: 06/12/2011 Document Revised: 07/14/2014 Document Reviewed: 04/27/2013 Elsevier Interactive Patient Education 2016 Elsevier Inc[employer]  

## 2015-08-01 MED FILL — LOSARTAN POTASSIUM 50 MG TA: 50 | 30 days supply | Qty: 30 | Fill #1

## 2015-08-01 MED FILL — AMLODIPINE BESYLATE 10 MG T: 10 | 30 days supply | Qty: 30 | Fill #1

## 2015-08-29 ENCOUNTER — Other Ambulatory Visit: Payer: Self-pay | Admitting: Internal Medicine

## 2015-08-29 DIAGNOSIS — E119 Type 2 diabetes mellitus without complications: Secondary | ICD-10-CM

## 2015-08-29 MED ORDER — SITAGLIPTIN PHOSPHATE 50 MG PO TABS: 50.0000 mg | ORAL_TABLET | Freq: Every day | ORAL | Status: DC

## 2015-09-04 ENCOUNTER — Ambulatory Visit (INDEPENDENT_AMBULATORY_CARE_PROVIDER_SITE_OTHER): Payer: Self-pay | Admitting: Family Medicine

## 2015-09-04 ENCOUNTER — Encounter: Payer: Self-pay | Admitting: Family Medicine

## 2015-09-04 ENCOUNTER — Other Ambulatory Visit: Payer: Self-pay

## 2015-09-04 VITALS — BP 136/62 | HR 100 | Temp 98.2°F | Ht 59.0 in | Wt 177.0 lb

## 2015-09-04 DIAGNOSIS — E669 Obesity, unspecified: Secondary | ICD-10-CM

## 2015-09-04 DIAGNOSIS — E119 Type 2 diabetes mellitus without complications: Secondary | ICD-10-CM

## 2015-09-04 DIAGNOSIS — E118 Type 2 diabetes mellitus with unspecified complications: Secondary | ICD-10-CM

## 2015-09-04 DIAGNOSIS — I10 Essential (primary) hypertension: Secondary | ICD-10-CM

## 2015-09-04 DIAGNOSIS — Z0000 Encounter for general adult medical examination without abnormal findings: Secondary | ICD-10-CM

## 2015-09-04 DIAGNOSIS — L680 Hirsutism: Secondary | ICD-10-CM

## 2015-09-04 LAB — POCT URINALYSIS DIP (DEVICE)
BILIRUBIN URINE: NEGATIVE
GLUCOSE, UA: 500 mg/dL — AB
Hgb urine dipstick: NEGATIVE
KETONES UR: NEGATIVE mg/dL
LEUKOCYTES UA: NEGATIVE
Nitrite: NEGATIVE
PROTEIN: NEGATIVE mg/dL
SPECIFIC GRAVITY, URINE: 1.02 (ref 1.005–1.030)
Urobilinogen, UA: 0.2 mg/dL (ref 0.0–1.0)
pH: 7 (ref 5.0–8.0)

## 2015-09-04 LAB — HEMOGLOBIN A1C
HEMOGLOBIN A1C: 8.8 % — AB (ref <5.7)
Mean Plasma Glucose: 206 mg/dL — ABNORMAL HIGH (ref <117)

## 2015-09-04 LAB — TSH: TSH: 2.01 m[IU]/L

## 2015-09-04 MED ORDER — CLONIDINE HCL 0.1 MG PO TABS
0.1000 mg | ORAL_TABLET | Freq: Once | ORAL | Status: AC
  Administered 2015-09-04: 0.1 mg via ORAL

## 2015-09-04 MED ORDER — LOSARTAN POTASSIUM 100 MG PO TABS: 100.0000 mg | ORAL_TABLET | Freq: Every day | ORAL | Status: DC

## 2015-09-04 MED ORDER — AMLODIPINE BESYLATE 10 MG PO TABS: 10.0000 mg | ORAL_TABLET | Freq: Every day | ORAL | Status: DC

## 2015-09-04 MED ORDER — METFORMIN HCL 1000 MG PO TABS: 1000.0000 mg | ORAL_TABLET | Freq: Two times a day (BID) | ORAL | Status: AC

## 2015-09-04 MED ORDER — LOSARTAN POTASSIUM 100 MG PO TABS: 50.0000 mg | ORAL_TABLET | Freq: Every day | ORAL | Status: DC

## 2015-09-04 MED FILL — ?METFORMIN HCL 1,000 MG TAB: 1000 | 30 days supply | Qty: 60 | Fill #0

## 2015-09-04 MED FILL — AMLODIPINE BESYLATE 10 MG T: 10 | 30 days supply | Qty: 30 | Fill #2

## 2015-09-04 MED FILL — LOSARTAN POTASSIUM 50 MG TA: 50 | 30 days supply | Qty: 30 | Fill #2

## 2015-09-04 NOTE — Progress Notes (Signed)
Subjective:    [name], female    DOB: Dec 09, 1975, 40 y.o.   MRN: [id number]  HPI Ms. Traci Warren, a 40 year old female with a history of uncontrolled diabetes and hypertension presents for a 3 month followup. Ms. Bouse states that she has been without Januvia for greater than 2 weeks. She maintains that her pharmacy was unable to fill Januvia, she will have to apply for a medication assistance program. She states that she exercises minimally follow a low fat/low carbohydrate diet. Patient does not check blood sugars consistently at home. She states that she has been taking Garcinia Cambogia to assist with weight loss with minimal results.  Patient denies foot ulcerations, nausea, polydipsia, polyuria, visual disturbances, vomitting and weight loss.  Patient also has a history of hypertension. She states that she was diagnosed several years ago. She does not check blood pressure at home. She denies dizziness, fatigue, chest palpitations,or swelling to lower extremities.  Cardiovascular risk factors include: obesity (BMI >= 30 kg/m2) and sedentary lifestyle.   Past Medical History  Diagnosis Date  . Hypertension   . Diabetes mellitus (HCC)    Immunization History  Administered Date(s) Administered  . Td 07/07/2009   Allergies  Allergen Reactions  . Latex    Social History   Social History  . Marital Status: Married    Spouse Name: N/A  . Number of Children: N/A  . Years of Education: N/A   Occupational History  . Not on file.   Social History Main Topics  . Smoking status: Never Smoker   . Smokeless tobacco: Not on file  . Alcohol Use: No  . Drug Use: No  . Sexual Activity: No   Other Topics Concern  . Not on file   Social History Narrative    Review of Systems  Constitutional: Negative.  Negative for fever, fatigue and unexpected weight change.  HENT: Negative.  Negative for congestion.   Eyes: Negative.  Negative for visual disturbance.   Respiratory: Negative.  Negative for cough.   Cardiovascular: Negative.   Gastrointestinal: Negative.   Endocrine: Negative.  Negative for polydipsia, polyphagia and polyuria.  Genitourinary: Negative.  Negative for vaginal discharge.  Musculoskeletal: Negative.   Skin: Negative.   Allergic/Immunologic: Negative.   Neurological: Negative.  Negative for tremors, weakness and numbness.  Hematological: Negative.   Psychiatric/Behavioral: Negative.  Negative for suicidal ideas and sleep disturbance.        Objective:   Physical Exam  Constitutional: She is oriented to person, place, and time. She appears well-developed and well-nourished.  HENT:  Head: Normocephalic and atraumatic.  Right Ear: External ear normal.  Left Ear: External ear normal.  Mouth/Throat: Oropharynx is clear and moist.  Eyes: Conjunctivae and EOM are normal. Pupils are equal, round, and reactive to light.  Neck: Normal range of motion. Neck supple.  Cardiovascular: Normal rate, regular rhythm, normal heart sounds and intact distal pulses.   Pulmonary/Chest: Effort normal and breath sounds normal.  Abdominal: Soft. Bowel sounds are normal.  Musculoskeletal: Normal range of motion.  Neurological: She is alert and oriented to person, place, and time. She has normal strength and normal reflexes. No cranial nerve deficit or sensory deficit. She displays a negative Romberg sign.  Monofilament test negative  Skin: Skin is warm and dry.  Hirsutism to face Hyperpigmented skin thickening to neck  Psychiatric: She has a normal mood and affect. Her behavior is normal. Judgment and thought content normal.  BP 178/86 mmHg  Pulse 100  Temp(Src) 98.2 F (36.8 C)  Ht  (1.499 m)  Wt 177 lb (80.287 kg)  BMI 35.73 kg/m2  LMP 08/19/2015 Assessment & Plan:  1. Uncontrolled hypertension Blood pressure is above goal on current medication regimen. Will increase Losartan to 100 mg daily. The patient is asked to make an  attempt to improve diet and exercise patterns to aid in medical management of this problem. - losartan (COZAAR) 100 MG tablet; Take 1 tablet (100 mg total) by mouth daily.  Dispense: 30 tablet; Refill: 0 - cloNIDine (CATAPRES) tablet 0.1 mg; Take 1 tablet (0.1 mg total) by mouth once. - COMPLETE METABOLIC PANEL WITH GFR  2. Type 2 diabetes mellitus with complication, without long-term current use of insulin (HCC) Will check hemoglobin a1C. Patient has not been unable to obtain Januvia so she has been on monotherapy for type 2 diabetes mellitus. Will check a1c, if it continues to be above goal, will add a 2nd agent.  - Hemoglobin A1c  3. Female hirsutism - Testosterone  4. OBESITY Recommend a lowfat, low carbohydrate diet divided over 5-6 small meals, increase water intake to 6-8 glasses, and 150 minutes per week of cardiovascular exercise.   - TSH   Routine Health Maintenance:  Patient was advised to discontinue Garcinia Cambogia for weight loss due to potential interactions with current medication regimen.   RTC: 1 month for hypertension  The patient was given clear instructions to go to ER or return to medical center if symptoms do not improve, worsen or new problems develop. The patient verbalized understanding. Will notify patient with laboratory results. Massie Maroon, FNP

## 2015-09-04 NOTE — Patient Instructions (Signed)
Will increase Losartan to 100 mg daily. Will follow up in 1 month for hypertension.  Will follow up by phone with laboratory results.   Hirsutism and Masculinization Hirsutism (increased body hair) is the growth of colored (pigmented) thick hair in women. It is most noticeable when it is on the moustache or beard areas. The other common sites are the:  Chest.  Abdomen.  Thighs.  Back. Pubic hair growth may run upward from the usual bikini line to the middle of the abdomen.  Virilism (masculinization) is more extensive than hirsutism. It has extra symptoms. There may be:  Acne.  Oily skin.  Baldness.  Enlargement of the clitoris.  Increased sex drive (libido).  Voice deepening.  Reduced breast size.  Irregular or absent periods.  Aggression. The scalp hair growth may also bald in a typical female pattern. CAUSES  This is caused by too much female sex hormone (androgen) in the body. It can be produced by the ovaries, adrenal glands, and within the skin. Hirsutism is most commonly related to polycystic ovarian syndrome (PCOS). The first signs of increased androgen levels are hirsutism and acne. How sensitive each person is to hormone levels varies greatly. Virilism may result from higher androgen levels. Some women with hirsutism have normal hormone levels. Eventually there may be female pattern balding. These problems are also connected to difficulty in having children (infertility). In addition, both malignant and benign tumors may cause hirsutism such as tumors of the adrenal gland (adenomas or adenocarcinomas) but this is a rare cause. There is also evidence that insulin resistance may cause the androgynism. This problem is treated with some success with a medicine for diabetes (metformin). Causes that come from outside the body (exogenous) may also lead to hirsutism such as intake of androgens by mouth.  Note: Women of [place], and [place]  heritage commonly have facial, abdominal, and thigh hair that is normal for them.  TREATMENT  There are medical treatments that inhibit these conditions, such as:  Combined oral contraceptive pills, if you are not trying to become pregnant.  Medicines that stop the production of hormones (gonadotropins).  Steroids. This may be used if there is evidence of congenital (present since birth) adrenal hyperplasia (abnormal growth of cells).  Metformin for the treatment of virilization, if sensitive to insulin.  Suppression of ovarian hormone production with GnRH analogues (a hormone). They can only be used by themselves for short periods of time. There are a variety of cosmetic treatments. These may be all that you need. They may be effective against occasional problems.  Shaving is the simplest and most effective in the short term. Bleaching is not usually suitable for severe hirsutism.  Plucking, waxing, sugaring (similar to waxing), and depilatory creams are effective. However, on occasion, they can result in skin irritation (inflammation) or infection.  Electrolysis is effective. Your caregiver can help you decide what needs to be done and what course of treatment will be best for you. Your caregiver may refer you to an endocrinologist. This is a physician who is specialized in the treatment of glandular disorders.   This information is not intended to replace advice given to you by your health care provider. Make sure you discuss any questions you have with your health care provider.   Document Released: 09/01/2001 Document Revised: 07/14/2014 Document Reviewed: 10/18/2008 Elsevier Interactive Patient Education 2016 [employer]. Diabetes and Exercise Exercising regularly is important. It is not just about losing weight. It has many health  benefits, such as:  Improving your overall fitness, flexibility, and endurance.  Increasing your bone density.  Helping with weight  control.  Decreasing your body fat.  Increasing your muscle strength.  Reducing stress and tension.  Improving your overall health. People with diabetes who exercise gain additional benefits because exercise:  Reduces appetite.  Improves the body's use of blood sugar (glucose).  Helps lower or control blood glucose.  Decreases blood pressure.  Helps control blood lipids (such as cholesterol and triglycerides).  Improves the body's use of the hormone insulin by:  Increasing the body's insulin sensitivity.  Reducing the body's insulin needs.  Decreases the risk for heart disease because exercising:  Lowers cholesterol and triglycerides levels.  Increases the levels of good cholesterol (such as high-density lipoproteins [HDL]) in the body.  Lowers blood glucose levels. YOUR ACTIVITY PLAN  Choose an activity that you enjoy, and set realistic goals. To exercise safely, you should begin practicing any new physical activity slowly, and gradually increase the intensity of the exercise over time. Your health care provider or diabetes educator can help create an activity plan that works for you. General recommendations include:  Encouraging children to engage in at least 60 minutes of physical activity each day.  Stretching and performing strength training exercises, such as yoga or weight lifting, at least 2 times per week.  Performing a total of at least 150 minutes of moderate-intensity exercise each week, such as brisk walking or water aerobics.  Exercising at least 3 days per week, making sure you allow no more than 2 consecutive days to pass without exercising.  Avoiding long periods of inactivity (90 minutes or more). When you have to spend an extended period of time sitting down, take frequent breaks to walk or stretch. RECOMMENDATIONS FOR EXERCISING WITH TYPE 1 OR TYPE 2 DIABETES   Check your blood glucose before exercising. If blood glucose levels are greater than 240  mg/dL, check for urine ketones. Do not exercise if ketones are present.  Avoid injecting insulin into areas of the body that are going to be exercised. For example, avoid injecting insulin into:  The arms when playing tennis.  The legs when jogging.  Keep a record of:  Food intake before and after you exercise.  Expected peak times of insulin action.  Blood glucose levels before and after you exercise.  The type and amount of exercise you have done.  Review your records with your health care provider. Your health care provider will help you to develop guidelines for adjusting food intake and insulin amounts before and after exercising.  If you take insulin or oral hypoglycemic agents, watch for signs and symptoms of hypoglycemia. They include:  Dizziness.  Shaking.  Sweating.  Chills.  Confusion.  Drink plenty of water while you exercise to prevent dehydration or heat stroke. Body water is lost during exercise and must be replaced.  Talk to your health care provider before starting an exercise program to make sure it is safe for you. Remember, almost any type of activity is better than none.   This information is not intended to replace advice given to you by your health care provider. Make sure you discuss any questions you have with your health care provider.   Document Released: 09/13/2003 Document Revised: 11/07/2014 Document Reviewed: 11/30/2012 Elsevier Interactive Patient Education 2016 Elsevier Inc. DASH Eating Plan DASH stands for "Dietary Approaches to Stop Hypertension." The DASH eating plan is a healthy eating plan that has been shown to  reduce high blood pressure (hypertension). Additional health benefits may include reducing the risk of type 2 diabetes mellitus, heart disease, and stroke. The DASH eating plan may also help with weight loss. WHAT DO I NEED TO KNOW ABOUT THE DASH EATING PLAN? For the DASH eating plan, you will follow these general  guidelines:  Choose foods with a percent daily value for sodium of less than 5% (as listed on the food label).  Use salt-free seasonings or herbs instead of table salt or sea salt.  Check with your health care provider or pharmacist before using salt substitutes.  Eat lower-sodium products, often labeled as "lower sodium" or "no salt added."  Eat fresh foods.  Eat more vegetables, fruits, and low-fat dairy products.  Choose whole grains. Look for the word "whole" as the first word in the ingredient list.  Choose fish and skinless chicken or [place] more often than red meat. Limit fish, poultry, and meat to 6 oz (170 g) each day.  Limit sweets, desserts, sugars, and sugary drinks.  Choose heart-healthy fats.  Limit cheese to 1 oz (28 g) per day.  Eat more home-cooked food and less restaurant, buffet, and fast food.  Limit fried foods.  Cook foods using methods other than frying.  Limit canned vegetables. If you do use them, rinse them well to decrease the sodium.  When eating at a restaurant, ask that your food be prepared with less salt, or no salt if possible. WHAT FOODS CAN I EAT? Seek help from a dietitian for individual calorie needs. Grains Whole grain or whole wheat bread. Brown rice. Whole grain or whole wheat pasta. Quinoa, bulgur, and whole grain cereals. Low-sodium cereals. Corn or whole wheat flour tortillas. Whole grain cornbread. Whole grain crackers. Low-sodium crackers. Vegetables Fresh or frozen vegetables (raw, steamed, roasted, or grilled). Low-sodium or reduced-sodium tomato and vegetable juices. Low-sodium or reduced-sodium tomato sauce and paste. Low-sodium or reduced-sodium canned vegetables.  Fruits All fresh, canned (in natural juice), or frozen fruits. Meat and Other Protein Products Ground beef (85% or leaner), grass-fed beef, or beef trimmed of fat. Skinless chicken or [place]. Ground chicken or [place]. Pork trimmed of fat. All fish and seafood.  Eggs. Dried beans, peas, or lentils. Unsalted nuts and seeds. Unsalted canned beans. Dairy Low-fat dairy products, such as skim or 1% milk, 2% or reduced-fat cheeses, low-fat ricotta or cottage cheese, or plain low-fat yogurt. Low-sodium or reduced-sodium cheeses. Fats and Oils Tub margarines without trans fats. Light or reduced-fat mayonnaise and salad dressings (reduced sodium). Avocado. Safflower, olive, or canola oils. Natural peanut or almond butter. Other Unsalted popcorn and pretzels. The items listed above may not be a complete list of recommended foods or beverages. Contact your dietitian for more options. WHAT FOODS ARE NOT RECOMMENDED? Grains White bread. White pasta. White rice. Refined cornbread. Bagels and croissants. Crackers that contain trans fat. Vegetables Creamed or fried vegetables. Vegetables in a cheese sauce. Regular canned vegetables. Regular canned tomato sauce and paste. Regular tomato and vegetable juices. Fruits Dried fruits. Canned fruit in light or heavy syrup. Fruit juice. Meat and Other Protein Products Fatty cuts of meat. Ribs, chicken wings, bacon, sausage, bologna, salami, chitterlings, fatback, hot dogs, bratwurst, and packaged luncheon meats. Salted nuts and seeds. Canned beans with salt. Dairy Whole or 2% milk, cream, half-and-half, and cream cheese. Whole-fat or sweetened yogurt. Full-fat cheeses or blue cheese. Nondairy creamers and whipped toppings. Processed cheese, cheese spreads, or cheese curds. Condiments Onion and garlic salt, seasoned salt, table  salt, and sea salt. Canned and packaged gravies. Worcestershire sauce. Tartar sauce. Barbecue sauce. Teriyaki sauce. Soy sauce, including reduced sodium. Steak sauce. Fish sauce. Oyster sauce. Cocktail sauce. Horseradish. Ketchup and mustard. Meat flavorings and tenderizers. Bouillon cubes. Hot sauce. Tabasco sauce. Marinades. Taco seasonings. Relishes. Fats and Oils Butter, stick margarine, lard,  shortening, ghee, and bacon fat. Coconut, palm kernel, or palm oils. Regular salad dressings. Other Pickles and olives. Salted popcorn and pretzels. The items listed above may not be a complete list of foods and beverages to avoid. Contact your dietitian for more information. WHERE CAN I FIND MORE INFORMATION? National Heart, Lung, and Blood Institute: [employer]   This information is not intended to replace advice given to you by your health care provider. Make sure you discuss any questions you have with your health care provider.   Document Released: 06/12/2011 Document Revised: 07/14/2014 Document Reviewed: 04/27/2013 Elsevier Interactive Patient Education 2016 Elsevier Inc. Losartan tablets What is this medicine? LOSARTAN (loe SAR tan) is used to treat high blood pressure and to reduce the risk of stroke in certain patients. This drug also slows the progression of kidney disease in patients with diabetes. This medicine may be used for other purposes; ask your health care provider or pharmacist if you have questions. What should I tell my health care provider before I take this medicine? They need to know if you have any of these conditions: -heart failure -kidney or liver disease -an unusual or allergic reaction to losartan, other medicines, foods, dyes, or preservatives -pregnant or trying to get pregnant -breast-feeding How should I use this medicine? Take this medicine by mouth with a glass of water. Follow the directions on the prescription label. This medicine can be taken with or without food. Take your doses at regular intervals. Do not take your medicine more often than directed. Talk to your pediatrician regarding the use of this medicine in children. Special care may be needed. Overdosage: If you think you have taken too much of this medicine contact a poison control center or emergency room at once. NOTE: This medicine is only for  you. Do not share this medicine with others. What if I miss a dose? If you miss a dose, take it as soon as you can. If it is almost time for your next dose, take only that dose. Do not take double or extra doses. What may interact with this medicine? -blood pressure medicines -diuretics, especially triamterene, spironolactone, or amiloride -fluconazole -NSAIDs, medicines for pain and inflammation, like ibuprofen or naproxen -potassium salts or potassium supplements -rifampin This list may not describe all possible interactions. Give your health care provider a list of all the medicines, herbs, non-prescription drugs, or dietary supplements you use. Also tell them if you smoke, drink alcohol, or use illegal drugs. Some items may interact with your medicine. What should I watch for while using this medicine? Visit your doctor or health care professional for regular checks on your progress. Check your blood pressure as directed. Ask your doctor or health care professional what your blood pressure should be and when you should contact him or her. Call your doctor or health care professional if you notice an irregular or fast heart beat. Women should inform their doctor if they wish to become pregnant or think they might be pregnant. There is a potential for serious side effects to an unborn child, particularly in the second or third trimester. Talk to your health care professional or pharmacist for more information. You  may get drowsy or dizzy. Do not drive, use machinery, or do anything that needs mental alertness until you know how this drug affects you. Do not stand or sit up quickly, especially if you are an older patient. This reduces the risk of dizzy or fainting spells. Alcohol can make you more drowsy and dizzy. Avoid alcoholic drinks. Avoid salt substitutes unless you are told otherwise by your doctor or health care professional. Do not treat yourself for coughs, colds, or pain while you are  taking this medicine without asking your doctor or health care professional for advice. Some ingredients may increase your blood pressure. What side effects may I notice from receiving this medicine? Side effects that you should report to your doctor or health care professional as soon as possible: -confusion, dizziness, light headedness or fainting spells -decreased amount of urine passed -difficulty breathing or swallowing, hoarseness, or tightening of the throat -fast or irregular heart beat, palpitations, or chest pain -skin rash, itching -swelling of your face, lips, tongue, hands, or feet Side effects that usually do not require medical attention (report to your doctor or health care professional if they continue or are bothersome): -cough -decreased sexual function or desire -headache -nasal congestion or stuffiness -nausea or stomach pain -sore or cramping muscles This list may not describe all possible side effects. Call your doctor for medical advice about side effects. You may report side effects to FDA at 1-800-FDA-1088. Where should I keep my medicine? Keep out of the reach of children. Store at room temperature between 15 and 30 degrees C (59 and 86 degrees F). Protect from light. Keep container tightly closed. Throw away any unused medicine after the expiration date. NOTE: This sheet is a summary. It may not cover all possible information. If you have questions about this medicine, talk to your doctor, pharmacist, or health care provider.    2016, Elsevier/Gold Standard. (2007-09-03 16:42:18)

## 2015-09-04 NOTE — Telephone Encounter (Signed)
Refills requested have been sent in.

## 2015-09-05 ENCOUNTER — Telehealth: Payer: Self-pay

## 2015-09-05 LAB — COMPLETE METABOLIC PANEL WITH GFR
ALBUMIN: 3.8 g/dL (ref 3.6–5.1)
ALK PHOS: 56 U/L (ref 33–115)
ALT: 18 U/L (ref 6–29)
AST: 14 U/L (ref 10–30)
BUN: 8 mg/dL (ref 7–25)
CALCIUM: 9.5 mg/dL (ref 8.6–10.2)
CHLORIDE: 100 mmol/L (ref 98–110)
CO2: 24 mmol/L (ref 20–31)
Creat: 0.77 mg/dL (ref 0.50–1.10)
GFR, Est African American: >89 mL/min (ref >=60)
Glucose, Bld: 319 mg/dL — ABNORMAL HIGH (ref 65–99)
Potassium: 4 mmol/L (ref 3.5–5.3)
SODIUM: 135 mmol/L (ref 135–146)
Total Bilirubin: 0.3 mg/dL (ref 0.2–1.2)
Total Protein: 7.8 g/dL (ref 6.1–8.1)

## 2015-09-05 LAB — TESTOSTERONE: Testosterone: 39 ng/dL

## 2015-09-05 NOTE — Telephone Encounter (Signed)
-----   Message from Massie Maroon, [place] sent at 09/05/2015  6:51 AM EST ----- Regarding: lab results Please inform patient that hemoglobin a1C has decreased on current medication regimen. Please continue medications as previously prescribed. Will communicate with pharmacy to find a replacement med for Januvia. Follow up for hypertension as scheduled.   Thanks ----- Message -----    From: [facility]    Sent: 09/05/2015   5:01 AM      To: Massie Maroon, FNP

## 2015-09-05 NOTE — Telephone Encounter (Signed)
Called and spoke with patient, advised of improvement in hgba1c and to continue current medications as directed. Advised patient that we would  Contact pharmacy to see what replacement for januvia to use and call patient when complete. Asked patient to keep follow up appointment for bp. Patient verbalized understanding of all instructions. Thanks!

## 2015-09-06 DIAGNOSIS — I10 Essential (primary) hypertension: Secondary | ICD-10-CM | POA: Insufficient documentation

## 2015-10-02 ENCOUNTER — Other Ambulatory Visit: Payer: Self-pay

## 2015-10-03 ENCOUNTER — Other Ambulatory Visit: Payer: Self-pay

## 2015-10-03 ENCOUNTER — Other Ambulatory Visit: Payer: Self-pay | Admitting: Family Medicine

## 2015-10-03 DIAGNOSIS — I10 Essential (primary) hypertension: Secondary | ICD-10-CM

## 2015-10-03 LAB — LIPID PANEL
CHOLESTEROL: 175 mg/dL (ref 125–200)
HDL: 49 mg/dL (ref >=46)
LDL Cholesterol: 104 mg/dL (ref <130)
TRIGLYCERIDES: 110 mg/dL (ref <150)
Total CHOL/HDL Ratio: 3.6 Ratio (ref <=5.0)
VLDL: 22 mg/dL (ref <30)

## 2015-10-03 LAB — COMPLETE METABOLIC PANEL WITH GFR
ALBUMIN: 3.8 g/dL (ref 3.6–5.1)
ALK PHOS: 51 U/L (ref 33–115)
ALT: 15 U/L (ref 6–29)
AST: 13 U/L (ref 10–30)
BILIRUBIN TOTAL: 0.3 mg/dL (ref 0.2–1.2)
BUN: 11 mg/dL (ref 7–25)
CALCIUM: 9.4 mg/dL (ref 8.6–10.2)
CHLORIDE: 102 mmol/L (ref 98–110)
CO2: 25 mmol/L (ref 20–31)
CREATININE: 0.85 mg/dL (ref 0.50–1.10)
GFR, Est Non African American: 86 mL/min (ref >=60)
Glucose, Bld: 202 mg/dL — ABNORMAL HIGH (ref 65–99)
Potassium: 3.8 mmol/L (ref 3.5–5.3)
Sodium: 137 mmol/L (ref 135–146)
TOTAL PROTEIN: 7.6 g/dL (ref 6.1–8.1)

## 2015-10-03 MED ORDER — LOSARTAN POTASSIUM 100 MG PO TABS: 100.0000 mg | ORAL_TABLET | Freq: Every day | ORAL | Status: DC

## 2015-10-03 MED ORDER — AMLODIPINE BESYLATE 10 MG PO TABS: 10.0000 mg | ORAL_TABLET | Freq: Every day | ORAL | Status: DC

## 2015-10-03 MED FILL — LOSARTAN POTASSIUM 100 MG T: 100 | 30 days supply | Qty: 30 | Fill #0

## 2015-10-03 MED FILL — AMLODIPINE BESYLATE 10 MG T: 10 | 30 days supply | Qty: 30 | Fill #0

## 2015-10-03 NOTE — Telephone Encounter (Signed)
Patient was in today for lab draw request a refill on bp meds. Theses have been refilled and sent to pharmacy. Thanks!

## 2015-10-04 ENCOUNTER — Ambulatory Visit: Payer: Self-pay | Admitting: Family Medicine

## 2015-10-09 ENCOUNTER — Encounter: Payer: Self-pay | Admitting: Family Medicine

## 2015-10-09 ENCOUNTER — Ambulatory Visit (INDEPENDENT_AMBULATORY_CARE_PROVIDER_SITE_OTHER): Payer: Self-pay | Admitting: Family Medicine

## 2015-10-09 VITALS — BP 165/84 | HR 102 | Temp 98.7°F | Resp 18 | Ht 59.0 in | Wt 177.0 lb

## 2015-10-09 DIAGNOSIS — I10 Essential (primary) hypertension: Secondary | ICD-10-CM

## 2015-10-09 DIAGNOSIS — E118 Type 2 diabetes mellitus with unspecified complications: Secondary | ICD-10-CM

## 2015-10-09 DIAGNOSIS — E119 Type 2 diabetes mellitus without complications: Secondary | ICD-10-CM

## 2015-10-09 LAB — POCT URINALYSIS DIP (DEVICE)
BILIRUBIN URINE: NEGATIVE
GLUCOSE, UA: 500 mg/dL — AB
Hgb urine dipstick: NEGATIVE
KETONES UR: NEGATIVE mg/dL
LEUKOCYTES UA: NEGATIVE
Nitrite: NEGATIVE
Protein, ur: NEGATIVE mg/dL
SPECIFIC GRAVITY, URINE: 1.015 (ref 1.005–1.030)
Urobilinogen, UA: 0.2 mg/dL (ref 0.0–1.0)
pH: 7 (ref 5.0–8.0)

## 2015-10-09 LAB — GLUCOSE, CAPILLARY: Glucose-Capillary: 218 mg/dL — ABNORMAL HIGH (ref 65–99)

## 2015-10-09 LAB — GLUCOSE, POCT (MANUAL RESULT ENTRY): POC Glucose: 218 mg/dl — AB (ref 70–99)

## 2015-10-09 MED ORDER — SITAGLIPTIN PHOSPHATE 50 MG PO TABS: 50.0000 mg | ORAL_TABLET | Freq: Every day | ORAL | Status: AC

## 2015-10-09 MED ORDER — AMLODIPINE BESYLATE 10 MG PO TABS: 10.0000 mg | ORAL_TABLET | Freq: Every day | ORAL | Status: AC

## 2015-10-09 MED ORDER — HYDROCHLOROTHIAZIDE 12.5 MG PO TABS: 12.5000 mg | ORAL_TABLET | Freq: Every day | ORAL | Status: DC

## 2015-10-09 MED ORDER — LOSARTAN POTASSIUM 100 MG PO TABS: 100.0000 mg | ORAL_TABLET | Freq: Every day | ORAL | Status: AC

## 2015-10-09 MED FILL — !JANUVIA 50 MG TABLET: 50 | 30 days supply | Qty: 30 | Fill #1

## 2015-10-09 NOTE — Patient Instructions (Signed)
Will start a trial of Hydrochlorothiazide 12.5 mg daily for hypertension. Return to office in 1 week for a blood pressure check.   Exercising to Lose Weight Exercising can help you to lose weight. In order to lose weight through exercise, you need to do vigorous-intensity exercise. You can tell that you are exercising with vigorous intensity if you are breathing very hard and fast and cannot hold a conversation while exercising. Moderate-intensity exercise helps to maintain your current weight. You can tell that you are exercising at a moderate level if you have a higher heart rate and faster breathing, but you are still able to hold a conversation. HOW OFTEN SHOULD I EXERCISE? Choose an activity that you enjoy and set realistic goals. Your health care provider can help you to make an activity plan that works for you. Exercise regularly as directed by your health care provider. This may include:  Doing resistance training twice each week, such as:  Push-ups.  Sit-ups.  Lifting weights.  Using resistance bands.  Doing a given intensity of exercise for a given amount of time. Choose from these options:  150 minutes of moderate-intensity exercise every week.  75 minutes of vigorous-intensity exercise every week.  A mix of moderate-intensity and vigorous-intensity exercise every week. Children, pregnant women, people who are out of shape, people who are overweight, and older adults may need to consult a health care provider for individual recommendations. If you have any sort of medical condition, be sure to consult your health care provider before starting a new exercise program. WHAT ARE SOME ACTIVITIES THAT CAN HELP ME TO LOSE WEIGHT?   Walking at a rate of at least 4.5 miles an hour.  Jogging or running at a rate of 5 miles per hour.  Biking at a rate of at least 10 miles per hour.  Lap swimming.  Roller-skating or in-line skating.  Cross-country skiing.  Vigorous competitive  sports, such as football, basketball, and soccer.  Jumping rope.  Aerobic dancing. HOW CAN I BE MORE ACTIVE IN MY DAY-TO-DAY ACTIVITIES?  Use the stairs instead of the elevator.  Take a walk during your lunch break.  If you drive, park your car farther away from work or school.  If you take public transportation, get off one stop early and walk the rest of the way.  Make all of your phone calls while standing up and walking around.  Get up, stretch, and walk around every 30 minutes throughout the day. WHAT GUIDELINES SHOULD I FOLLOW WHILE EXERCISING?  Do not exercise so much that you hurt yourself, feel dizzy, or get very short of breath.  Consult your health care provider prior to starting a new exercise program.  Wear comfortable clothes and shoes with good support.  Drink plenty of water while you exercise to prevent dehydration or heat stroke. Body water is lost during exercise and must be replaced.  Work out until you breathe faster and your heart beats faster.   This information is not intended to replace advice given to you by your health care provider. Make sure you discuss any questions you have with your health care provider.   Document Released: 07/26/2010 Document Revised: 07/14/2014 Document Reviewed: 11/24/2013 Elsevier Interactive Patient Education 2016 [employer]. Diabetes and Exercise Exercising regularly is important. It is not just about losing weight. It has many health benefits, such as:  Improving your overall fitness, flexibility, and endurance.  Increasing your bone density.  Helping with weight control.  Decreasing your body  fat.  Increasing your muscle strength.  Reducing stress and tension.  Improving your overall health. People with diabetes who exercise gain additional benefits because exercise:  Reduces appetite.  Improves the body's use of blood sugar (glucose).  Helps lower or control blood glucose.  Decreases blood  pressure.  Helps control blood lipids (such as cholesterol and triglycerides).  Improves the body's use of the hormone insulin by:  Increasing the body's insulin sensitivity.  Reducing the body's insulin needs.  Decreases the risk for heart disease because exercising:  Lowers cholesterol and triglycerides levels.  Increases the levels of good cholesterol (such as high-density lipoproteins [HDL]) in the body.  Lowers blood glucose levels. YOUR ACTIVITY PLAN  Choose an activity that you enjoy, and set realistic goals. To exercise safely, you should begin practicing any new physical activity slowly, and gradually increase the intensity of the exercise over time. Your health care provider or diabetes educator can help create an activity plan that works for you. General recommendations include:  Encouraging children to engage in at least 60 minutes of physical activity each day.  Stretching and performing strength training exercises, such as yoga or weight lifting, at least 2 times per week.  Performing a total of at least 150 minutes of moderate-intensity exercise each week, such as brisk walking or water aerobics.  Exercising at least 3 days per week, making sure you allow no more than 2 consecutive days to pass without exercising.  Avoiding long periods of inactivity (90 minutes or more). When you have to spend an extended period of time sitting down, take frequent breaks to walk or stretch. RECOMMENDATIONS FOR EXERCISING WITH TYPE 1 OR TYPE 2 DIABETES   Check your blood glucose before exercising. If blood glucose levels are greater than 240 mg/dL, check for urine ketones. Do not exercise if ketones are present.  Avoid injecting insulin into areas of the body that are going to be exercised. For example, avoid injecting insulin into:  The arms when playing tennis.  The legs when jogging.  Keep a record of:  Food intake before and after you exercise.  Expected peak times of  insulin action.  Blood glucose levels before and after you exercise.  The type and amount of exercise you have done.  Review your records with your health care provider. Your health care provider will help you to develop guidelines for adjusting food intake and insulin amounts before and after exercising.  If you take insulin or oral hypoglycemic agents, watch for signs and symptoms of hypoglycemia. They include:  Dizziness.  Shaking.  Sweating.  Chills.  Confusion.  Drink plenty of water while you exercise to prevent dehydration or heat stroke. Body water is lost during exercise and must be replaced.  Talk to your health care provider before starting an exercise program to make sure it is safe for you. Remember, almost any type of activity is better than none.   This information is not intended to replace advice given to you by your health care provider. Make sure you discuss any questions you have with your health care provider.   Document Released: 09/13/2003 Document Revised: 11/07/2014 Document Reviewed: 11/30/2012 Elsevier Interactive Patient Education 2016 Else[employer] Inc[employer] DASH Eating Plan DASH stands for "Dietary Approaches to Stop Hypertension." The DASH eating plan is a healthy eating plan that has been shown to reduce high blood pressure (hypertension). Additional health benefits may include reducing the risk of type 2 diabetes mellitus, heart disease, and stroke. The DASH eating  plan may also help with weight loss. WHAT DO I NEED TO KNOW ABOUT THE DASH EATING PLAN? For the DASH eating plan, you will follow these general guidelines:  Choose foods with a percent daily value for sodium of less than 5% (as listed on the food label).  Use salt-free seasonings or herbs instead of table salt or sea salt.  Check with your health care provider or pharmacist before using salt substitutes.  Eat lower-sodium products, often labeled as "lower sodium" or "no salt added."  Eat  fresh foods.  Eat more vegetables, fruits, and low-fat dairy products.  Choose whole grains. Look for the word "whole" as the first word in the ingredient list.  Choose fish and skinless chicken or [place] more often than red meat. Limit fish, poultry, and meat to 6 oz (170 g) each day.  Limit sweets, desserts, sugars, and sugary drinks.  Choose heart-healthy fats.  Limit cheese to 1 oz (28 g) per day.  Eat more home-cooked food and less restaurant, buffet, and fast food.  Limit fried foods.  Cook foods using methods other than frying.  Limit canned vegetables. If you do use them, rinse them well to decrease the sodium.  When eating at a restaurant, ask that your food be prepared with less salt, or no salt if possible. WHAT FOODS CAN I EAT? Seek help from a dietitian for individual calorie needs. Grains Whole grain or whole wheat bread. Brown rice. Whole grain or whole wheat pasta. Quinoa, bulgur, and whole grain cereals. Low-sodium cereals. Corn or whole wheat flour tortillas. Whole grain cornbread. Whole grain crackers. Low-sodium crackers. Vegetables Fresh or frozen vegetables (raw, steamed, roasted, or grilled). Low-sodium or reduced-sodium tomato and vegetable juices. Low-sodium or reduced-sodium tomato sauce and paste. Low-sodium or reduced-sodium canned vegetables.  Fruits All fresh, canned (in natural juice), or frozen fruits. Meat and Other Protein Products Ground beef (85% or leaner), grass-fed beef, or beef trimmed of fat. Skinless chicken or [place]key. Ground chicken or [place]key. Pork trimmed of fat. All fish and seafood. Eggs. Dried beans, peas, or lentils. Unsalted nuts and seeds. Unsalted canned beans. Dairy Low-fat dairy products, such as skim or 1% milk, 2% or reduced-fat cheeses, low-fat ricotta or cottage cheese, or plain low-fat yogurt. Low-sodium or reduced-sodium cheeses. Fats and Oils Tub margarines without trans fats. Light or reduced-fat mayonnaise and salad  dressings (reduced sodium). Avocado. Safflower, olive, or canola oils. Natural peanut or almond butter. Other Unsalted popcorn and pretzels. The items listed above may not be a complete list of recommended foods or beverages. Contact your dietitian for more options. WHAT FOODS ARE NOT RECOMMENDED? Grains White bread. White pasta. White rice. Refined cornbread. Bagels and croissants. Crackers that contain trans fat. Vegetables Creamed or fried vegetables. Vegetables in a cheese sauce. Regular canned vegetables. Regular canned tomato sauce and paste. Regular tomato and vegetable juices. Fruits Dried fruits. Canned fruit in light or heavy syrup. Fruit juice. Meat and Other Protein Products Fatty cuts of meat. Ribs, chicken wings, bacon, sausage, bologna, salami, chitterlings, fatback, hot dogs, bratwurst, and packaged luncheon meats. Salted nuts and seeds. Canned beans with salt. Dairy Whole or 2% milk, cream, half-and-half, and cream cheese. Whole-fat or sweetened yogurt. Full-fat cheeses or blue cheese. Nondairy creamers and whipped toppings. Processed cheese, cheese spreads, or cheese curds. Condiments Onion and garlic salt, seasoned salt, table salt, and sea salt. Canned and packaged gravies. Worcestershire sauce. Tartar sauce. Barbecue sauce. Teriyaki sauce. Soy sauce, including reduced sodium. Steak sauce. Fish sauce.  Oyster sauce. Cocktail sauce. Horseradish. Ketchup and mustard. Meat flavorings and tenderizers. Bouillon cubes. Hot sauce. Tabasco sauce. Marinades. Taco seasonings. Relishes. Fats and Oils Butter, stick margarine, lard, shortening, ghee, and bacon fat. Coconut, palm kernel, or palm oils. Regular salad dressings. Other Pickles and olives. Salted popcorn and pretzels. The items listed above may not be a complete list of foods and beverages to avoid. Contact your dietitian for more information. WHERE CAN I FIND MORE INFORMATION? National Heart, Lung, and Blood Institute:  [employer]   This information is not intended to replace advice given to you by your health care provider. Make sure you discuss any questions you have with your health care provider.   Document Released: 06/12/2011 Document Revised: 07/14/2014 Document Reviewed: 04/27/2013 Elsevier Interactive Patient Education [employer]

## 2015-10-09 NOTE — Progress Notes (Signed)
Subjective:    Patient ID: Traci Warren, female    DOB: Nov 05, 1975, 40 y.o.   MRN: [id number]  HPI Ms. Pina Sirianni, a 40 year old female with a history of uncontrolled diabetes and hypertension presents for a 1 month follow-up of uncontrolled hypertension. Ms. Ashmore states that she has been taking anti-hypertensive medications consistently.  She states that she exercises minimally and  follows a low fat/low carbohydrate diet. Patient does not check blood pressure consistently at home. She denies dizziness, fatigue, chest palpitations,or swelling to lower extremities.  Cardiovascular risk factors include: obesity (BMI >= 30 kg/m2) and sedentary lifestyle.   Past Medical History  Diagnosis Date  . Hypertension   . Diabetes mellitus (HCC)    Immunization History  Administered Date(s) Administered  . Td 07/07/2009   Allergies  Allergen Reactions  . Latex    Social History   Social History  . Marital Status: Married    Spouse Name: N/A  . Number of Children: N/A  . Years of Education: N/A   Occupational History  . Not on file.   Social History Main Topics  . Smoking status: Never Smoker   . Smokeless tobacco: Not on file  . Alcohol Use: No  . Drug Use: No  . Sexual Activity: No   Other Topics Concern  . Not on file   Social History Narrative    Review of Systems  Constitutional: Negative.  Negative for fever, fatigue and unexpected weight change.  HENT: Negative.  Negative for congestion.   Eyes: Negative.  Negative for visual disturbance.  Respiratory: Negative.  Negative for cough.   Cardiovascular: Negative.   Gastrointestinal: Negative.   Endocrine: Negative.  Negative for polydipsia, polyphagia and polyuria.  Genitourinary: Negative.  Negative for vaginal discharge.  Musculoskeletal: Negative.   Skin: Negative.   Allergic/Immunologic: Negative.   Neurological: Negative.  Negative for tremors, weakness and numbness.  Hematological: Negative.    Psychiatric/Behavioral: Negative.  Negative for suicidal ideas and sleep disturbance.        Objective:   Physical Exam  Constitutional: She is oriented to person, place, and time. She appears well-developed and well-nourished.  HENT:  Head: Normocephalic and atraumatic.  Right Ear: External ear normal.  Left Ear: External ear normal.  Mouth/Throat: Oropharynx is clear and moist.  Eyes: Conjunctivae and EOM are normal. Pupils are equal, round, and reactive to light.  Neck: Normal range of motion. Neck supple.  Cardiovascular: Normal rate, regular rhythm, normal heart sounds and intact distal pulses.   Pulmonary/Chest: Effort normal and breath sounds normal.  Abdominal: Soft. Bowel sounds are normal.  Musculoskeletal: Normal range of motion.  Neurological: She is alert and oriented to person, place, and time. She has normal strength and normal reflexes. No cranial nerve deficit or sensory deficit. She displays a negative Romberg sign.  Skin: Skin is warm and dry.  Hirsutism to face Hyperpigmented skin thickening to neck  Psychiatric: She has a normal mood and affect. Her behavior is normal. Judgment and thought content normal.     BP 165/84 mmHg  Pulse 102  Temp(Src) 98.7 F (37.1 C) (Oral)  Resp 18  Ht  (1.499 m)  Wt 177 lb (80.287 kg)  BMI 35.73 kg/m2  SpO2 99%  LMP 09/17/2015 Assessment & Plan:  1. Uncontrolled hypertension Blood pressure is not at goal on current medication regimen. Will add hydrochlorothiazide 12.5 mg daily. Patient will return in 1 week for a blood pressure check. The patient is asked  to make an attempt to improve diet and exercise patterns to aid in medical management of this problem. - POCT urinalysis dipstick - amLODipine (NORVASC) 10 MG tablet; Take 1 tablet (10 mg total) by mouth daily.  Dispense: 90 tablet; Refill: 0 - losartan (COZAAR) 100 MG tablet; Take 1 tablet (100 mg total) by mouth daily.  Dispense: 90 tablet; Refill: 0 -  hydrochlorothiazide (HYDRODIURIL) 12.5 MG tablet; Take 1 tablet (12.5 mg total) by mouth daily.  Dispense: 90 tablet; Refill: 0  2. Type 2 diabetes mellitus with complication, without long-term current use of insulin (HCC)  - Glucose (CBG) - POCT urinalysis dipstick - Glucose, capillary - sitaGLIPtin (JANUVIA) 50 MG tablet; Take 1 tablet (50 mg total) by mouth daily.  Dispense: 90 tablet; Refill: 0   RTC: 1 week for a blood pressure check and 3 months for hypertension.   The patient was given clear instructions to go to ER or return to medical center if symptoms do not improve, worsen or new problems develop. The patient verbalized understanding. Will notify patient with laboratory results. Massie MaroonHollis,Temiloluwa Recchia M, FNP

## 2015-10-09 NOTE — Progress Notes (Signed)
Patient is here for 1 month FU  Patient denies pain at this time.  Patient has taken medications this morning and patient has eaten yogurt and cranberry juice.  CBG-218 8.8 was last A1C-09/04/15  172/87 Second check on left arm

## 2015-10-11 ENCOUNTER — Other Ambulatory Visit: Payer: Self-pay

## 2015-10-11 DIAGNOSIS — I10 Essential (primary) hypertension: Secondary | ICD-10-CM

## 2015-10-11 MED ORDER — HYDROCHLOROTHIAZIDE 12.5 MG PO TABS: 12.5000 mg | ORAL_TABLET | Freq: Every day | ORAL | Status: AC

## 2015-10-11 MED FILL — ?HYDROCHLOROTHIAZIDE 12.5MG: 12.5 | 30 days supply | Qty: 30 | Fill #0

## 2015-10-11 NOTE — Telephone Encounter (Signed)
hctz sent back into pharmacy. Thanks!

## 2015-10-16 ENCOUNTER — Ambulatory Visit: Payer: Medicaid Other

## 2015-10-16 VITALS — BP 138/82

## 2015-10-16 DIAGNOSIS — I10 Essential (primary) hypertension: Secondary | ICD-10-CM

## 2016-03-11 IMAGING — US US OB COMP LESS 14 WK
1 series · 13 of 28 positions shown · non-contrast
Comparison: None of the current pregnancy

CLINICAL DATA: Abdominal pain and early pregnancy

EXAM:
OBSTETRIC <14 WK US AND TRANSVAGINAL OB US
TECHNIQUE: Both transabdominal and transvaginal ultrasound examinations were
performed for complete evaluation of the gestation as well as the
maternal uterus, adnexal regions, and pelvic cul-de-sac.
Transvaginal technique was performed to assess early pregnancy.

[Series 1: us ob comp less 14 wks · 13 of 59 slices shown]
[im 3/59]
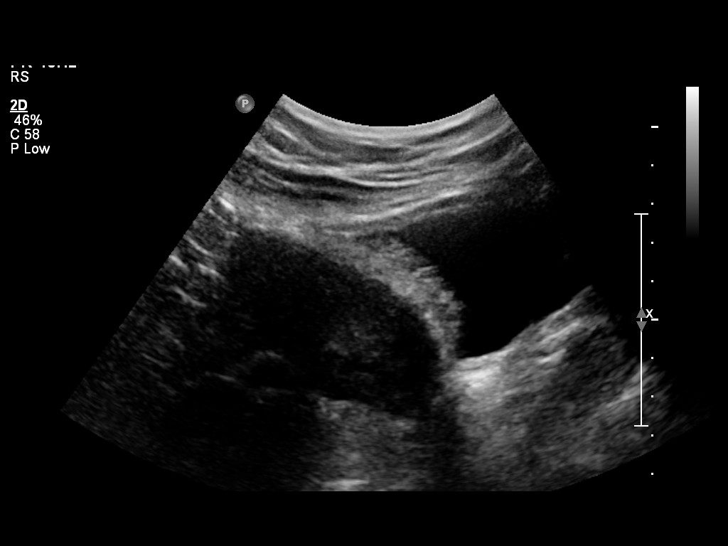
[im 7/59]
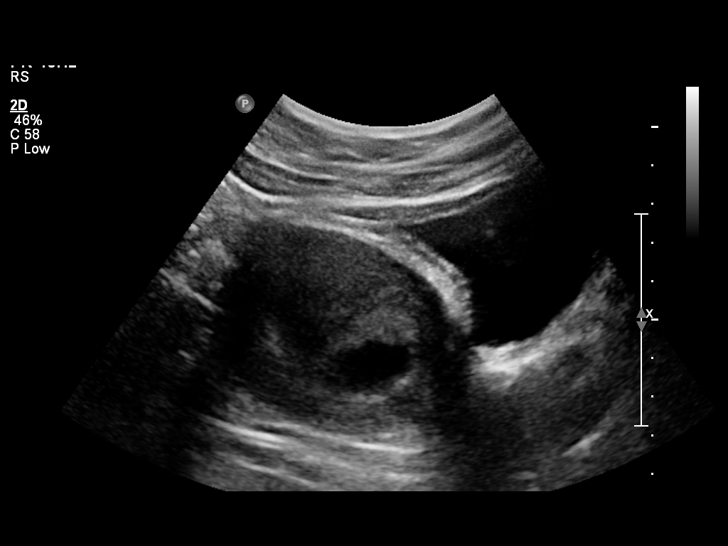
[im 11/59]
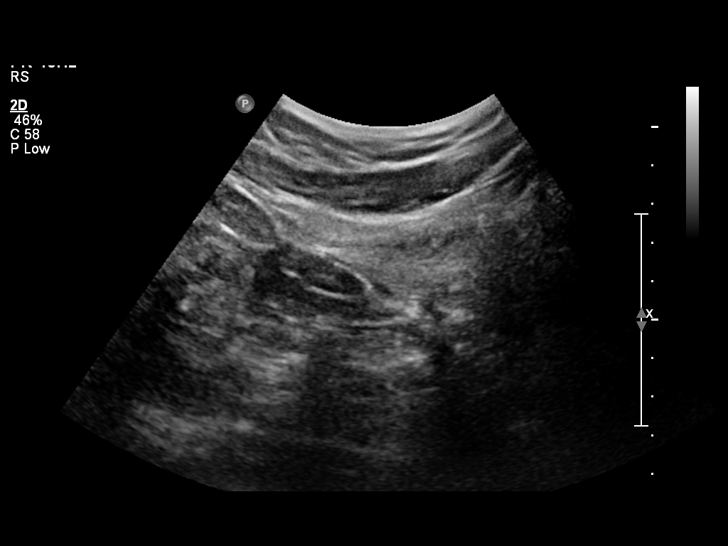
[im 16/59]
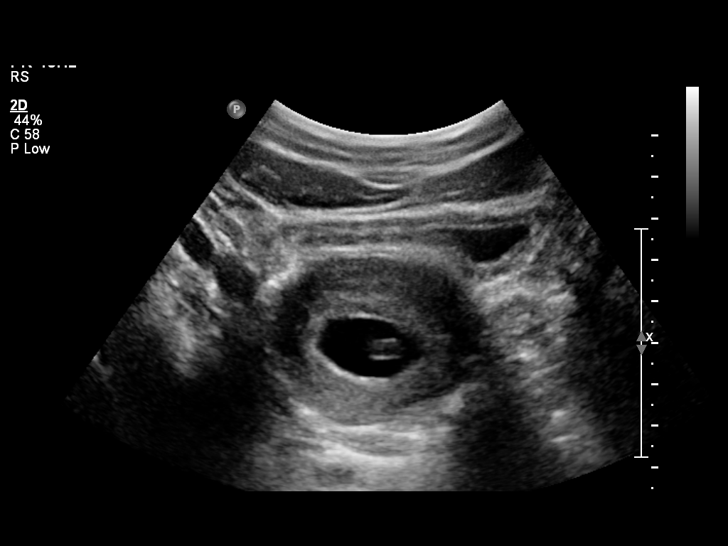
[im 20/59]
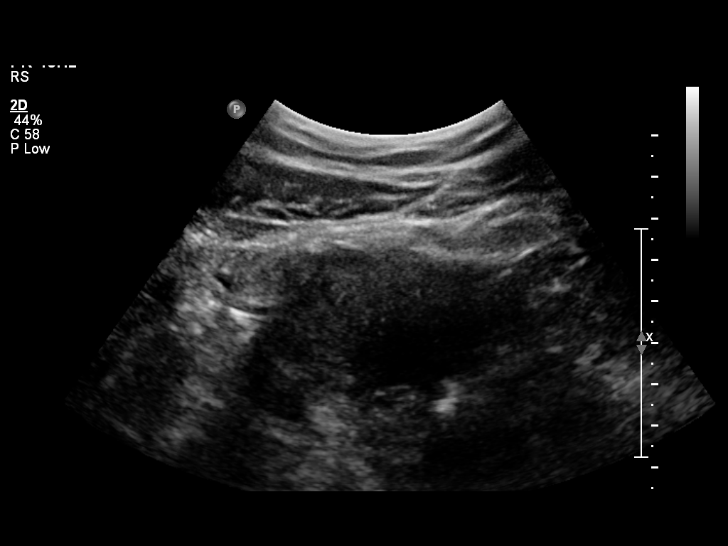
[im 24/59]
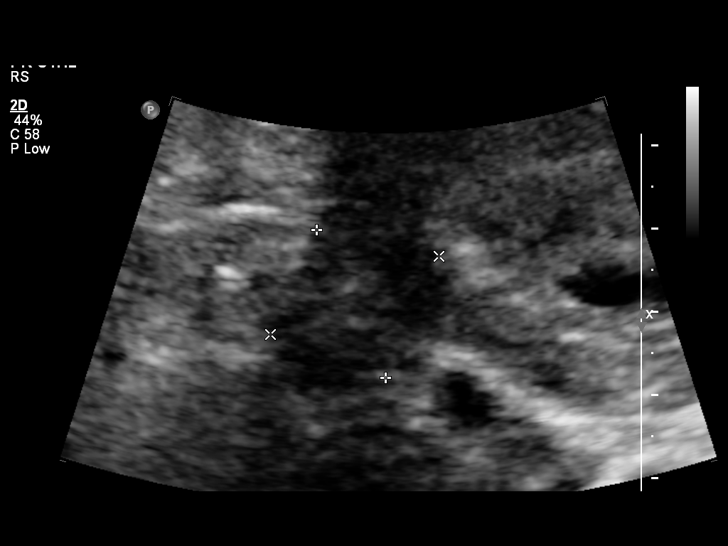
[im 31/59]
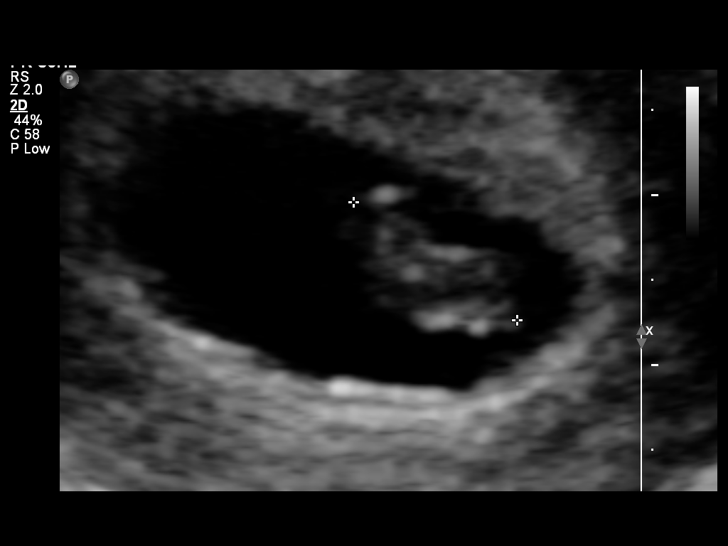
[im 35/59]
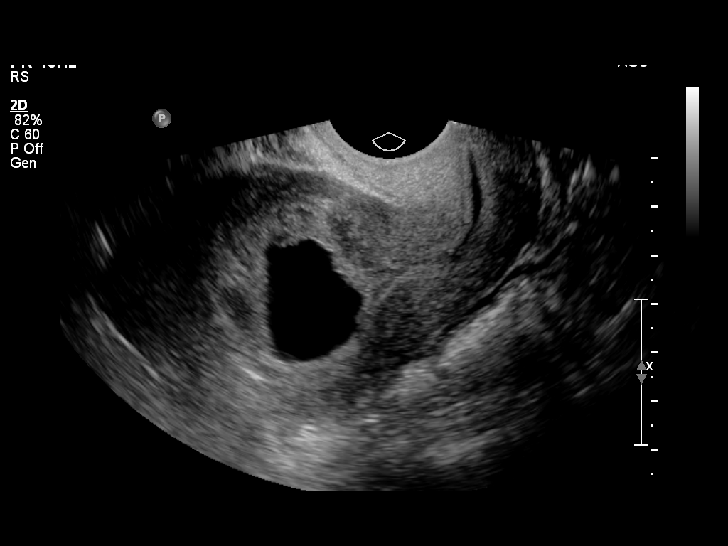
[im 39/59]
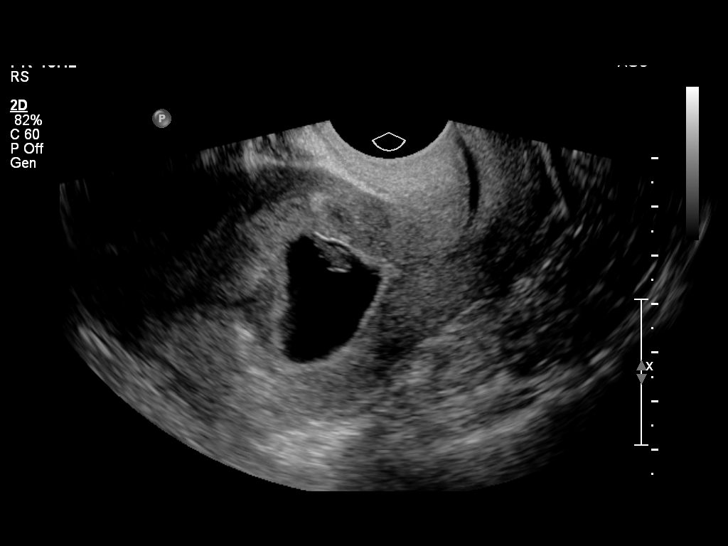
[im 43/59]
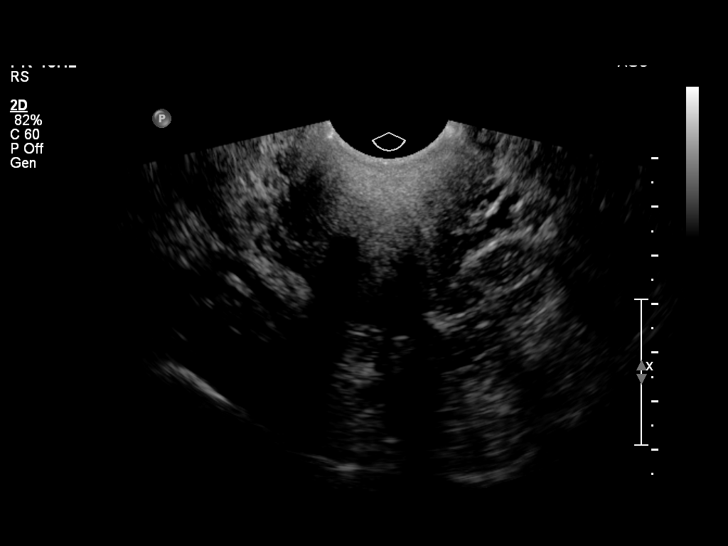
[im 48/59]
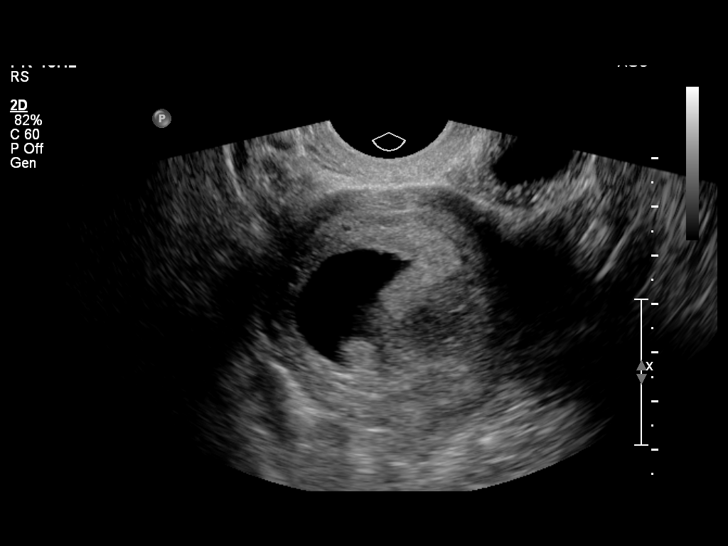
[im 52/59]
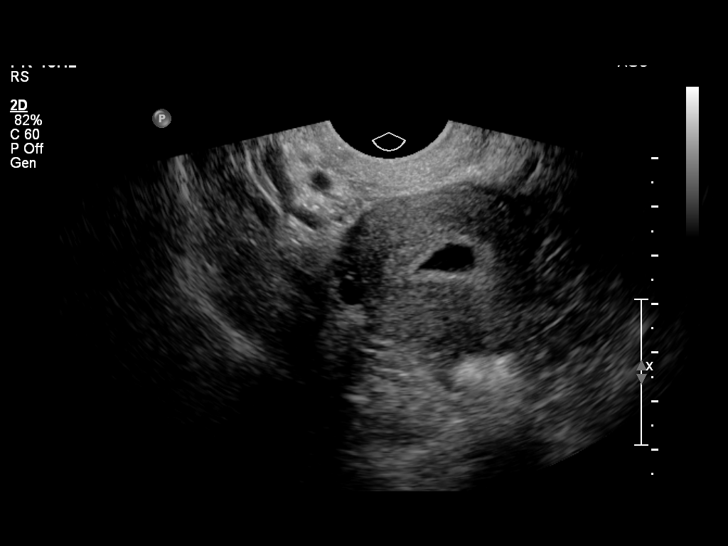
[im 56/59]
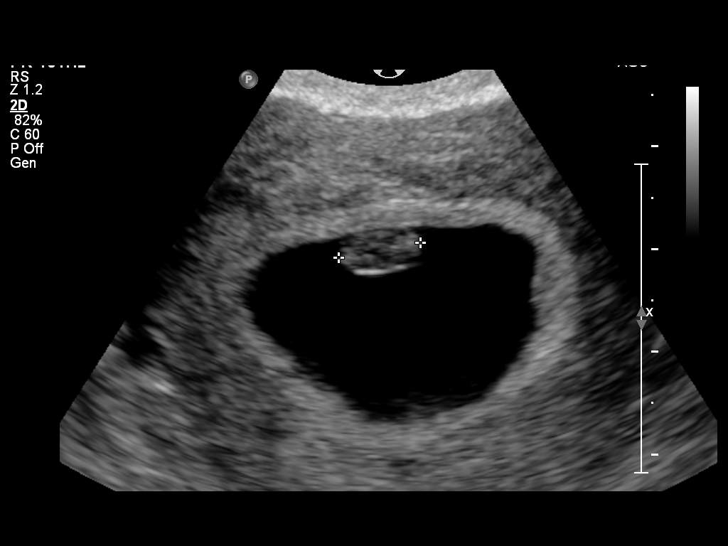

[13 of 28 positions shown; findings below may reference images not displayed]

FINDINGS: Intrauterine gestational sac: Present. The sac has a normal round
shape but is mildly low within the endometrial cavity. Additionally,
it appears large relative to the fetal size.

Yolk sac:  Not visible

Embryo:  Visible

Cardiac Activity: Not visible by Doppler or grayscale cine imaging.

CRL:   10  mm   7 w 1d

Maternal uterus/adnexae: The ovaries are symmetric in size and
unremarkable in appearance. No subchorionic hematoma. No free pelvic
fluid. No adnexal mass.
IMPRESSION: Single intrauterine gestation with 10 mm crown-rump length and no
cardiac activity. Findings meet definitive criteria for failed
pregnancy. This follows SRU consensus guidelines: Diagnostic
Criteria for Nonviable Pregnancy Early in the First Trimester. N
Engl J Med 0643;[DATE].
